# Patient Record
Sex: Female | Born: 1960 | Race: White | Hispanic: No | Marital: Married | Smoking: Former smoker
Health system: Southern US, Community
[De-identification: ages and names within clinical notes are randomized; demographics above are authoritative.]

## PROBLEM LIST (undated history)

## (undated) DIAGNOSIS — M1990 Unspecified osteoarthritis, unspecified site: Secondary | ICD-10-CM

## (undated) DIAGNOSIS — M810 Age-related osteoporosis without current pathological fracture: Secondary | ICD-10-CM

## (undated) DIAGNOSIS — IMO0002 Reserved for concepts with insufficient information to code with codable children: Secondary | ICD-10-CM

## (undated) DIAGNOSIS — M8580 Other specified disorders of bone density and structure, unspecified site: Secondary | ICD-10-CM

## (undated) DIAGNOSIS — K219 Gastro-esophageal reflux disease without esophagitis: Secondary | ICD-10-CM

## (undated) DIAGNOSIS — Z8619 Personal history of other infectious and parasitic diseases: Secondary | ICD-10-CM

## (undated) HISTORY — PX: BLADDER SUSPENSION: SHX72

## (undated) HISTORY — DX: Reserved for concepts with insufficient information to code with codable children: IMO0002

## (undated) HISTORY — PX: ROTATOR CUFF REPAIR: SHX139

## (undated) HISTORY — PX: WISDOM TOOTH EXTRACTION: SHX21

## (undated) HISTORY — PX: AUGMENTATION MAMMAPLASTY: SUR837

## (undated) HISTORY — DX: Personal history of other infectious and parasitic diseases: Z86.19

## (undated) HISTORY — DX: Gastro-esophageal reflux disease without esophagitis: K21.9

## (undated) HISTORY — DX: Age-related osteoporosis without current pathological fracture: M81.0

## (undated) HISTORY — DX: Other specified disorders of bone density and structure, unspecified site: M85.80

---

## 2007-10-12 DIAGNOSIS — IMO0002 Reserved for concepts with insufficient information to code with codable children: Secondary | ICD-10-CM

## 2007-10-12 HISTORY — DX: Reserved for concepts with insufficient information to code with codable children: IMO0002

## 2008-07-04 ENCOUNTER — Other Ambulatory Visit: Admission: RE | Admit: 2008-07-04 | Discharge: 2008-07-04 | Payer: Self-pay | Admitting: Gynecology

## 2008-07-04 ENCOUNTER — Ambulatory Visit: Payer: Self-pay | Admitting: Gynecology

## 2008-07-04 ENCOUNTER — Encounter: Payer: Self-pay | Admitting: Gynecology

## 2008-07-12 ENCOUNTER — Ambulatory Visit: Payer: Self-pay | Admitting: Gynecology

## 2009-02-08 LAB — HM MAMMOGRAPHY

## 2009-02-08 LAB — CONVERTED CEMR LAB: Pap Smear: NORMAL

## 2009-02-18 ENCOUNTER — Encounter: Admission: RE | Admit: 2009-02-18 | Discharge: 2009-02-18 | Payer: Self-pay | Admitting: Gynecology

## 2009-02-19 ENCOUNTER — Encounter: Payer: Self-pay | Admitting: Gynecology

## 2009-02-19 ENCOUNTER — Other Ambulatory Visit: Admission: RE | Admit: 2009-02-19 | Discharge: 2009-02-19 | Payer: Self-pay | Admitting: Gynecology

## 2009-02-19 ENCOUNTER — Ambulatory Visit: Payer: Self-pay | Admitting: Gynecology

## 2009-03-19 ENCOUNTER — Ambulatory Visit: Payer: Self-pay | Admitting: Family Medicine

## 2009-03-19 DIAGNOSIS — D239 Other benign neoplasm of skin, unspecified: Secondary | ICD-10-CM | POA: Insufficient documentation

## 2009-03-25 ENCOUNTER — Ambulatory Visit: Payer: Self-pay | Admitting: Family Medicine

## 2009-03-25 LAB — CONVERTED CEMR LAB
ALT: 22 units/L (ref 0–35)
Albumin: 4 g/dL (ref 3.5–5.2)
Basophils Relative: 0.3 % (ref 0.0–3.0)
Bilirubin, Direct: 0.1 mg/dL (ref 0.0–0.3)
CO2: 29 meq/L (ref 19–32)
Chloride: 106 meq/L (ref 96–112)
Creatinine, Ser: 0.7 mg/dL (ref 0.4–1.2)
Eosinophils Relative: 2.8 % (ref 0.0–5.0)
HCT: 37 % (ref 36.0–46.0)
Hemoglobin: 12.9 g/dL (ref 12.0–15.0)
LDL Cholesterol: 99 mg/dL (ref 0–99)
MCV: 93.9 fL (ref 78.0–100.0)
Monocytes Absolute: 0.6 10*3/uL (ref 0.1–1.0)
Neutro Abs: 3.2 10*3/uL (ref 1.4–7.7)
Neutrophils Relative %: 53.4 % (ref 43.0–77.0)
Nitrite: NEGATIVE
Potassium: 4.9 meq/L (ref 3.5–5.1)
RBC: 3.94 M/uL (ref 3.87–5.11)
Sodium: 138 meq/L (ref 135–145)
Specific Gravity, Urine: 1.015
Total CHOL/HDL Ratio: 3
Total Protein: 6.8 g/dL (ref 6.0–8.3)
Urobilinogen, UA: 0.2
WBC: 6 10*3/uL (ref 4.5–10.5)

## 2009-04-07 ENCOUNTER — Ambulatory Visit: Payer: Self-pay | Admitting: Family Medicine

## 2009-04-18 ENCOUNTER — Ambulatory Visit: Payer: Self-pay | Admitting: Internal Medicine

## 2009-04-29 ENCOUNTER — Ambulatory Visit: Payer: Self-pay | Admitting: Internal Medicine

## 2009-04-29 HISTORY — PX: COLONOSCOPY: SHX174

## 2009-12-16 ENCOUNTER — Other Ambulatory Visit: Admission: RE | Admit: 2009-12-16 | Discharge: 2009-12-16 | Payer: Self-pay | Admitting: Gynecology

## 2009-12-16 ENCOUNTER — Ambulatory Visit: Payer: Self-pay | Admitting: Gynecology

## 2010-03-27 ENCOUNTER — Ambulatory Visit: Payer: Self-pay | Admitting: Family Medicine

## 2010-03-27 DIAGNOSIS — D179 Benign lipomatous neoplasm, unspecified: Secondary | ICD-10-CM | POA: Insufficient documentation

## 2010-03-27 DIAGNOSIS — M25469 Effusion, unspecified knee: Secondary | ICD-10-CM

## 2010-03-27 DIAGNOSIS — M7710 Lateral epicondylitis, unspecified elbow: Secondary | ICD-10-CM | POA: Insufficient documentation

## 2010-05-06 ENCOUNTER — Ambulatory Visit: Payer: Self-pay | Admitting: Family Medicine

## 2010-05-06 DIAGNOSIS — H6640 Suppurative otitis media, unspecified, unspecified ear: Secondary | ICD-10-CM | POA: Insufficient documentation

## 2010-06-01 ENCOUNTER — Ambulatory Visit: Payer: Self-pay | Admitting: Family Medicine

## 2010-11-10 NOTE — Assessment & Plan Note (Signed)
Summary: SWOLLEN AREA IN BACK OF LEG/CJR   Vital Signs:  Patient profile:   50 year old female Temp:     98 degrees F oral BP sitting:   110 / 70  (left arm) Cuff size:   regular  Vitals Entered By: Sid Falcon LPN (March 27, 2010 4:15 PM) CC: Left back of knee lump, right elbow   History of Present Illness: Patient seen for several items as follows.  Right lateral elbow pain for the past several weeks. Frequent does weight lifting. Worse with gripping activities. Right-hand dominant. No specific injury. No visible swelling. Pain is lateral location without radiation. Tender to palpation. No alleviating factors. Has not tried icing.  Patient noted 3 days ago nonpainful swelling left proximal leg posteriorly. No pain with activities such as running.  See has noticed some mild swelling of the left knee over the past week or so. No locking or giving way. No pain with running with the exception of one episode of pain 2 days ago. She was able to run without difficulty today. No prior history of knee problems.  Allergies (verified): No Known Drug Allergies  Past History:  Family History: Last updated: 03/19/2009 Leukemia, mother  Social History: Last updated: 03/19/2009 Occupation:  Sales Divorced Alcohol use-yes Previous smoker  Review of Systems  The patient denies anorexia, fever, and weight loss.    Physical Exam  General:  Well-developed,well-nourished,in no acute distress; alert,appropriate and cooperative throughout examination Head:  Normocephalic and atraumatic without obvious abnormalities. No apparent alopecia or balding. Lungs:  Normal respiratory effort, chest expands symmetrically. Lungs are clear to auscultation, no crackles or wheezes. Heart:  normal rate and regular rhythm.   Extremities:  right elbow reveals no visible swelling. Full range of motion. No warmth or erythema. Tender over her lateral epicondyle.  Left leg reveals over the medial posterior  aspect of the proximal leg she has approximately 2 cm fatty lipomatous mass which is nontender. No overlying skin changes. No popliteal swelling. Left knee exam reveals very mild effusion. Cruciate and collateral ligament testing is normal. No medial or lateral joint line tenderness. Neurologic:  she has full strength lower extremities and symmetric reflexes. Skin:  no rashes and no suspicious lesions.     Knee Exam  Skin:    Intact, no scars, lesions, rashes, cafe au lait spots, or bruising.    Palpation:    Non-tender to palpation over medial joint line, lateral joint line, parapatellar, condylar, patellar tendon, or Pes bursa.   Vascular:    There was no swelling or varicose veins. The pulses and temperature are normal. There was no edema or tenderness.  Motor:    Motor strength 5/5 bilaterally for quadriceps, hamstrings, ankle dorsiflexion, and ankle plantar flexion.    Knee Exam:    Left:    Stability:  stable    Tenderness:  no    Swelling:  diffuse    Erythema:  no    Inspection/Palpation:  minimal effusion    Range of Motion:       Flexion-Active: full       Extension-Active: full       Flexion-Passive: full       Extension-Passive: full   Impression & Recommendations:  Problem # 1:  LIPOMA (ICD-214.9) Assessment New reassured this is benign. Imaging or further workup only if this is enlarging or becomes painful  Problem # 2:  JOINT EFFUSION, KNEE (ICD-719.06) Assessment: New very small effusion which is not related to #1.  Possible mild meniscal irritation. To avoid squatting-type activities and observe for now. This is not limiting or painful in any way at this point  Problem # 3:  LATERAL EPICONDYLITIS (ICD-726.32) Assessment: New recommend  regular icing. Offered corticosteroid injection and she wishes to observe. Avoid aggravating activities.  Complete Medication List: 1)  Fluoxetine Hcl 20 Mg Tabs (Fluoxetine hcl) .... Once daily 2)  Vitamin C 500 Mg Tabs  (Ascorbic acid) .... Once daily 3)  Vitamin D 1000 Unit Tabs (Cholecalciferol) .... Once daily  Patient Instructions: 1)  You have a small lipoma which is a benign fatty tumor involving the proximal leg 2)  You have a mild left knee joint effusion which is frequently seen with meniscal irritation 3)  You appear to have some tendinitis involving the right elbow. Ice regularly 2-3 times daily

## 2010-11-10 NOTE — Assessment & Plan Note (Signed)
Summary: severe ear ache/?inj/loss of balance/cjr   Vital Signs:  Patient profile:   50 year old female Temp:     98 degrees F oral BP sitting:   92 / 62  (left arm) Cuff size:   regular  Vitals Entered By: Sid Falcon LPN (May 06, 2010 12:19 PM) CC: Severe right ear pain   History of Present Illness: One-week history of right earache. Patient denies any fever, chills, or dizziness. Mild sore throat. Minimal nasal congestion. No cough. No known drug allergies. No history of recurrent otitis media. Nonsmoker. Exacerbated by lying of R side.  Minimal improvement with Advil.  Allergies (verified): No Known Drug Allergies  Review of Systems      See HPI  Physical Exam  General:  Well-developed,well-nourished,in no acute distress; alert,appropriate and cooperative throughout examination Ears:  left internal appears normal. Right eardrum reveals purulent effusion. Nose no skin erythema. Loss of landmarks. No evidence for perforation Mouth:  Oral mucosa and oropharynx without lesions or exudates.  Teeth in good repair. Neck:  No deformities, masses, or tenderness noted. Lungs:  Normal respiratory effort, chest expands symmetrically. Lungs are clear to auscultation, no crackles or wheezes. Heart:  normal rate and regular rhythm.     Impression & Recommendations:  Problem # 1:  UNSPECIFIED SUPPURATIVE OTITIS MEDIA (ICD-382.4) Assessment New  Her updated medication list for this problem includes:    Amoxicillin 875 Mg Tabs (Amoxicillin) ..... One by mouth two times a day for 10 days  Complete Medication List: 1)  Fluoxetine Hcl 20 Mg Tabs (Fluoxetine hcl) .... Once daily 2)  Vitamin C 500 Mg Tabs (Ascorbic acid) .... Once daily 3)  Vitamin D 1000 Unit Tabs (Cholecalciferol) .... Once daily 4)  Amoxicillin 875 Mg Tabs (Amoxicillin) .... One by mouth two times a day for 10 days  Patient Instructions: 1)  Followup in 2 weeks if ear symptoms are not  resolving Prescriptions: AMOXICILLIN 875 MG TABS (AMOXICILLIN) one by mouth two times a day for 10 days  #20 x 0   Entered and Authorized by:   Evelena Peat MD   Signed by:   Evelena Peat MD on 05/06/2010   Method used:   Electronically to        [facility]. [phone]* (retail)       [address]       [place], [place]  [address]       Ph: [phone]       Fax: [phone]   RxID:   [id number]

## 2010-11-10 NOTE — Assessment & Plan Note (Signed)
Summary: ear pain//ccm   Vital Signs:  Patient profile:   50 year old female Temp:     98.7 degrees F oral BP sitting:   98 / 70  (left arm) Cuff size:   regular  Vitals Entered By: Sid Falcon LPN (June 01, 2010 1:09 PM)  History of Present Illness: Is seen with recurrent right ear pain. Refer to prior note. Symptoms did improve with amoxicillin initially. Recurrence of symptoms yesterday. Occasional fleeting vertigo. Also some right sided facial pains. No purulent secretions. Denies fever or chills. No hearing loss  Allergies (verified): No Known Drug Allergies  Past History:  Social History: Last updated: 03/19/2009 Occupation:  Sales Divorced Alcohol use-yes Previous smoker  Past Medical History: no chronic problems. PMH reviewed for relevance, SH/Risk Factors reviewed for relevance  Physical Exam  General:  Well-developed,well-nourished,in no acute distress; alert,appropriate and cooperative throughout examination Ears:  patient has persistent right supperative type effusion but no erythema no bulging. Left eardrum is normal Mouth:  Oral mucosa and oropharynx without lesions or exudates.  Teeth in good repair. Neck:  No deformities, masses, or tenderness noted. Lungs:  Normal respiratory effort, chest expands symmetrically. Lungs are clear to auscultation, no crackles or wheezes.   Impression & Recommendations:  Problem # 1:  UNSPECIFIED SUPPURATIVE OTITIS MEDIA (ICD-382.4) broaden coverage to Augmentin.  ENT referral if no better 2 weeks. Her updated medication list for this problem includes:    Amoxicillin-pot Clavulanate 875-125 Mg Tabs (Amoxicillin-pot clavulanate) ..... One by mouth two times a day for 10 days  Complete Medication List: 1)  Fluoxetine Hcl 20 Mg Tabs (Fluoxetine hcl) .... Once daily 2)  Vitamin C 500 Mg Tabs (Ascorbic acid) .... Once daily 3)  Vitamin D 1000 Unit Tabs (Cholecalciferol) .... Once daily 4)  Amoxicillin-pot Clavulanate  875-125 Mg Tabs (Amoxicillin-pot clavulanate) .... One by mouth two times a day for 10 days  Patient Instructions: 1)  Call in 2 weeks if symptoms not resolved or if any recurrence of symptoms. Prescriptions: AMOXICILLIN-POT CLAVULANATE 875-125 MG TABS (AMOXICILLIN-POT CLAVULANATE) one by mouth two times a day for 10 days  #20 x 0   Entered and Authorized by:   Evelena Peat MD   Signed by:   Evelena Peat MD on 06/01/2010   Method used:   Electronically to        [facility]. [phone]* (retail)       [address]       [place], [place]  [address]       Ph: [phone]       Fax: [phone]   RxID:   [id number]

## 2010-12-18 ENCOUNTER — Encounter: Payer: Self-pay | Admitting: Gynecology

## 2010-12-21 ENCOUNTER — Encounter (INDEPENDENT_AMBULATORY_CARE_PROVIDER_SITE_OTHER): Payer: BC Managed Care – PPO | Admitting: Gynecology

## 2010-12-21 ENCOUNTER — Other Ambulatory Visit (HOSPITAL_COMMUNITY)
Admission: RE | Admit: 2010-12-21 | Discharge: 2010-12-21 | Disposition: A | Discharge status: Home / Self Care | Payer: BC Managed Care – PPO | Source: Ambulatory Visit | Attending: Gynecology | Admitting: Gynecology

## 2010-12-21 ENCOUNTER — Other Ambulatory Visit: Payer: Self-pay | Admitting: Gynecology

## 2010-12-21 DIAGNOSIS — Z13220 Encounter for screening for lipoid disorders: Secondary | ICD-10-CM

## 2010-12-21 DIAGNOSIS — Z124 Encounter for screening for malignant neoplasm of cervix: Secondary | ICD-10-CM | POA: Insufficient documentation

## 2010-12-21 DIAGNOSIS — Z833 Family history of diabetes mellitus: Secondary | ICD-10-CM

## 2010-12-21 DIAGNOSIS — N926 Irregular menstruation, unspecified: Secondary | ICD-10-CM

## 2010-12-21 DIAGNOSIS — Z01419 Encounter for gynecological examination (general) (routine) without abnormal findings: Secondary | ICD-10-CM

## 2011-10-15 ENCOUNTER — Telehealth: Payer: Self-pay | Admitting: Family Medicine

## 2011-10-15 ENCOUNTER — Other Ambulatory Visit: Payer: Self-pay | Admitting: *Deleted

## 2011-10-15 ENCOUNTER — Encounter: Payer: Self-pay | Admitting: Family

## 2011-10-15 ENCOUNTER — Ambulatory Visit (INDEPENDENT_AMBULATORY_CARE_PROVIDER_SITE_OTHER): Payer: BC Managed Care – PPO | Admitting: Family

## 2011-10-15 DIAGNOSIS — H6980 Other specified disorders of Eustachian tube, unspecified ear: Secondary | ICD-10-CM

## 2011-10-15 DIAGNOSIS — J069 Acute upper respiratory infection, unspecified: Secondary | ICD-10-CM

## 2011-10-15 MED ORDER — FEXOFENADINE-PSEUDOEPHED ER 180-240 MG PO TB24: 1.0000 | ORAL_TABLET | Freq: Every day | ORAL | Status: DC

## 2011-10-15 MED ORDER — ANTIPYRINE-BENZOCAINE 5.4-1.4 % OT SOLN: 3.0000 [drp] | OTIC | Status: DC | PRN

## 2011-10-15 MED ORDER — ANTIPYRINE-BENZOCAINE 5.4-1.4 % OT SOLN: 3.0000 [drp] | OTIC | Status: AC | PRN

## 2011-10-15 NOTE — Patient Instructions (Signed)
Upper Respiratory Infection, Adult An upper respiratory infection (URI) is also sometimes known as the common cold. The upper respiratory tract includes the nose, sinuses, throat, trachea, and bronchi. Bronchi are the airways leading to the lungs. Most people improve within 1 week, but symptoms can last up to 2 weeks. A residual cough may last even longer.  CAUSES Many different viruses can infect the tissues lining the upper respiratory tract. The tissues become irritated and inflamed and often become very moist. Mucus production is also common. A cold is contagious. You can easily spread the virus to others by oral contact. This includes kissing, sharing a glass, coughing, or sneezing. Touching your mouth or nose and then touching a surface, which is then touched by another person, can also spread the virus. SYMPTOMS  Symptoms typically develop 1 to 3 days after you come in contact with a cold virus. Symptoms vary from person to person. They may include:  Runny nose.   Sneezing.   Nasal congestion.   Sinus irritation.   Sore throat.   Loss of voice (laryngitis).   Cough.   Fatigue.   Muscle aches.   Loss of appetite.   Headache.   Low-grade fever.  DIAGNOSIS  You might diagnose your own cold based on familiar symptoms, since most people get a cold 2 to 3 times a year. Your caregiver can confirm this based on your exam. Most importantly, your caregiver can check that your symptoms are not due to another disease such as strep throat, sinusitis, pneumonia, asthma, or epiglottitis. Blood tests, throat tests, and X-rays are not necessary to diagnose a common cold, but they may sometimes be helpful in excluding other more serious diseases. Your caregiver will decide if any further tests are required. RISKS AND COMPLICATIONS  You may be at risk for a more severe case of the common cold if you smoke cigarettes, have chronic heart disease (such as heart failure) or lung disease (such as  asthma), or if you have a weakened immune system. The very young and very old are also at risk for more serious infections. Bacterial sinusitis, middle ear infections, and bacterial pneumonia can complicate the common cold. The common cold can worsen asthma and chronic obstructive pulmonary disease (COPD). Sometimes, these complications can require emergency medical care and may be life-threatening. PREVENTION  The best way to protect against getting a cold is to practice good hygiene. Avoid oral or hand contact with people with cold symptoms. Wash your hands often if contact occurs. There is no clear evidence that vitamin C, vitamin E, echinacea, or exercise reduces the chance of developing a cold. However, it is always recommended to get plenty of rest and practice good nutrition. TREATMENT  Treatment is directed at relieving symptoms. There is no cure. Antibiotics are not effective, because the infection is caused by a virus, not by bacteria. Treatment may include:  Increased fluid intake. Sports drinks offer valuable electrolytes, sugars, and fluids.   Breathing heated mist or steam (vaporizer or shower).   Eating chicken soup or other clear broths, and maintaining good nutrition.   Getting plenty of rest.   Using gargles or lozenges for comfort.   Controlling fevers with ibuprofen or acetaminophen as directed by your caregiver.   Increasing usage of your inhaler if you have asthma.  Zinc gel and zinc lozenges, taken in the first 24 hours of the common cold, can shorten the duration and lessen the severity of symptoms. Pain medicines may help with fever, muscle   aches, and throat pain. A variety of non-prescription medicines are available to treat congestion and runny nose. Your caregiver can make recommendations and may suggest nasal or lung inhalers for other symptoms.  HOME CARE INSTRUCTIONS   Only take over-the-counter or prescription medicines for pain, discomfort, or fever as directed  by your caregiver.   Use a warm mist humidifier or inhale steam from a shower to increase air moisture. This may keep secretions moist and make it easier to breathe.   Drink enough water and fluids to keep your urine clear or pale yellow.   Rest as needed.   Return to work when your temperature has returned to normal or as your caregiver advises. You may need to stay home longer to avoid infecting others. You can also use a face mask and careful hand washing to prevent spread of the virus.  SEEK MEDICAL CARE IF:   After the first few days, you feel you are getting worse rather than better.   You need your caregiver's advice about medicines to control symptoms.   You develop chills, worsening shortness of breath, or brown or red sputum. These may be signs of pneumonia.   You develop yellow or brown nasal discharge or pain in the face, especially when you bend forward. These may be signs of sinusitis.   You develop a fever, swollen neck glands, pain with swallowing, or white areas in the back of your throat. These may be signs of strep throat.  SEEK IMMEDIATE MEDICAL CARE IF:   You have a fever.   You develop severe or persistent headache, ear pain, sinus pain, or chest pain.   You develop wheezing, a prolonged cough, cough up blood, or have a change in your usual mucus (if you have chronic lung disease).   You develop sore muscles or a stiff neck.  Document Released: 03/23/2001 Document Revised: 06/09/2011 Document Reviewed: 01/29/2011 [facility] Patient Information 2012 [place], [place].   Barotitis Media Barotitis media is soreness (inflammation) of the area behind the eardrum (middle ear). This occurs when the auditory tube (Eustachian tube) leading from the back of the throat to the eardrum is blocked. When it is blocked air cannot move in and out of the middle ear to equalize pressure changes. These pressure changes come from changes in altitude when:  Flying.   Driving in the  mountains.   Diving.  Problems are more likely to occur with pressure changes during times when you are congested as from:  Hay fever.   Upper respiratory infection.   A cold.  Damage or hearing loss (barotrauma) caused by this may be permanent. HOME CARE INSTRUCTIONS   Use medicines as recommended by your caregiver. Over the counter medicines will help unblock the canal and can help during times of air travel.   Do not put anything into your ears to clean or unplug them. Eardrops will not be helpful.   Do not swim, dive, or fly until your caregiver says it is all right to do so. If these activities are necessary, chewing gum with frequent swallowing may help. It is also helpful to hold your nose and gently blow to pop your ears for equalizing pressure changes. This forces air into the Eustachian tube.   For little ones with problems, give your baby a bottle of water or juice during periods when pressure changes would be anticipated such as during take offs and landings associated with air travel.   Only take over-the-counter or prescription medicines for pain,  discomfort, or fever as directed by your caregiver.   A decongestant may be helpful in de-congesting the middle ear and make pressure equalization easier. This can be even more effective if the drops (spray) are delivered with the head lying over the edge of a bed with the head tilted toward the ear on the affected side.   If your caregiver has given you a follow-up appointment, it is very important to keep that appointment. Not keeping the appointment could result in a chronic or permanent injury, pain, hearing loss and disability. If there is any problem keeping the appointment, you must call back to this facility for assistance.  SEEK IMMEDIATE MEDICAL CARE IF:   You develop a severe headache, dizziness, severe ear pain, or bloody or pus-like drainage from your ears.   An oral temperature above 102 F (38.9 C) develops.    Your problems do not improve or become worse.  MAKE SURE YOU:   Understand these instructions.   Will watch your condition.   Will get help right away if you are not doing well or get worse.  Document Released: 09/24/2000 Document Revised: 06/09/2011 Document Reviewed: 05/02/2008 [facility] Patient Information 2012 [place], [place].

## 2011-10-15 NOTE — Telephone Encounter (Signed)
fyi- pharmacy needs to be updated to [facility] at [employer] received one rx and clarified the other rx

## 2011-10-15 NOTE — Telephone Encounter (Signed)
Pt informed on VM of correction

## 2011-10-15 NOTE — Telephone Encounter (Signed)
Pt when to pharmacy to pick up meds and pharmacy said they did not receive scripts, pt requesting for meds to be re-submitted to [facility] and then contact her

## 2011-10-15 NOTE — Progress Notes (Signed)
  Subjective:    Patient ID: Jackie Hubbard, female    DOB: 1961/05/23, 51 y.o.   MRN: [id number]  HPI 51 year old white female, former smoker smoker patient of Dr. Caryl Never is in today with complaints of right ear pain, cough, sore throat and fatigue and lack of 2 days. She's taken NyQuil and Mucinex over-the-counter with no relief. Denies any fever, shortness of breath, chest pain, or myalgias.   Review of Systems  Constitutional: Positive for fatigue.  HENT: Positive for ear pain.   Eyes: Negative.   Respiratory: Positive for cough.   Cardiovascular: Negative.   Musculoskeletal: Negative.   Skin: Negative.   Neurological: Negative.   Hematological: Negative.   Psychiatric/Behavioral: Negative.        Objective:   Physical Exam  Constitutional: She is oriented to person, place, and time. She appears well-developed and well-nourished.  HENT:  Right Ear: External ear normal.  Left Ear: External ear normal.  Nose: Nose normal.  Mouth/Throat: Oropharynx is clear and moist.       Ears bilaterally small amount of fluid.   Eyes: Conjunctivae are normal. Pupils are equal, round, and reactive to light.  Neck: Normal range of motion.  Cardiovascular: Normal rate and normal heart sounds.   Pulmonary/Chest: Effort normal.  Musculoskeletal: Normal range of motion.  Neurological: She is alert and oriented to person, place, and time.  Skin: Skin is warm and dry.  Psychiatric: She has a normal mood and affect.          Assessment & Plan:  Assessment: Viral upper respiratory infection, eustachian tube dysfunction  Plan: Allegra-D 24 hours by mouth daily. Auralgan otic 3 drops in the right ear every 2 hours when necessary pain. Patient thought the office if symptoms worsen or persist, recheck reschedule when necessary.

## 2012-04-18 ENCOUNTER — Other Ambulatory Visit: Payer: Self-pay | Admitting: Gynecology

## 2012-04-18 DIAGNOSIS — Z1231 Encounter for screening mammogram for malignant neoplasm of breast: Secondary | ICD-10-CM

## 2012-04-25 ENCOUNTER — Telehealth: Payer: Self-pay | Admitting: Family Medicine

## 2012-04-25 ENCOUNTER — Ambulatory Visit (INDEPENDENT_AMBULATORY_CARE_PROVIDER_SITE_OTHER): Payer: BC Managed Care – PPO | Admitting: Family Medicine

## 2012-04-25 ENCOUNTER — Encounter: Payer: Self-pay | Admitting: Family Medicine

## 2012-04-25 VITALS — BP 106/60 | HR 65 | Temp 98.4°F | Wt 138.0 lb

## 2012-04-25 DIAGNOSIS — R0789 Other chest pain: Secondary | ICD-10-CM

## 2012-04-25 DIAGNOSIS — K219 Gastro-esophageal reflux disease without esophagitis: Secondary | ICD-10-CM

## 2012-04-25 MED ORDER — OMEPRAZOLE 40 MG PO CPDR: 40.0000 mg | DELAYED_RELEASE_CAPSULE | Freq: Every day | ORAL | Status: DC

## 2012-04-25 NOTE — Telephone Encounter (Signed)
Appt made with Dr. Clent Ridges for 3:45. Pt was happy with coming to office to see someone.

## 2012-04-25 NOTE — Progress Notes (Signed)
  Subjective:    Patient ID: Jackie Hubbard, female    DOB: November 28, 1960, 51 y.o.   MRN: [id number]  HPI Here for 2 weeks of frequent chest pains which she describes as dull achy pains or pressure like pains in the center of the chest. These are not associated with exertion. There is no SOB or nausea. She does have frequent sweats, but she thinks this is due to menopause since her LMP was 4 months ago. She has lots of heartburn and indigestion, and food sometimes stops partway down when swallowing. She takes TUMS several times a day. She has been under a lot of stress lately. 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 She worked long hours, ate an erratic diet, and drank lots of [employer] coffee. Now she is back home and she feels better, although the chest pains continue. She is a runner, and exercising has no effect on these pains.    Review of Systems  Constitutional: Negative.   Respiratory: Negative.   Cardiovascular: Positive for chest pain. Negative for palpitations and leg swelling.  Gastrointestinal: Negative.        Objective:   Physical Exam  Constitutional: She appears well-developed and well-nourished. No distress.  Neck: Neck supple. No thyromegaly present.  Cardiovascular: Normal rate, regular rhythm, normal heart sounds and intact distal pulses.  Exam reveals no gallop and no friction rub.   No murmur heard.      EKG shows incomplete RBBB only   Pulmonary/Chest: Effort normal and breath sounds normal. No respiratory distress. She has no wheezes. She has no rales. She exhibits no tenderness.  Lymphadenopathy:    She has no cervical adenopathy.          Assessment & Plan:  She definitely has GERD, and her chest pains are probably esophageal in nature. We discussed reducing her caffeine intake. Try Omeprazole daily at least for a few weeks. Recheck prn

## 2012-04-25 NOTE — Telephone Encounter (Signed)
Caller: Susy/Mother; PCP: Evelena Peat; CB#: [phone]; ; ; Call regarding Chest Pain/Chest Discomfort;  Onset-couple of weeks ago per pt. She c/o of pain between breast in center of her chest. It is worst at night. Pt thinks she may have pulled a muscle.  She rates pain at a 5 today. Emergent s/s of Chest pain protocol states to ED. Per protocol must give a heads up, per [facility] in office Dr. Caryl Never recommends pt come into office today for evaluation or she can go to ED if desired. Pt chooses to come into office today, call tranferred to [facility] to schedule.

## 2012-05-01 ENCOUNTER — Ambulatory Visit
Admission: RE | Admit: 2012-05-01 | Discharge: 2012-05-01 | Disposition: A | Discharge status: Home / Self Care | Payer: BC Managed Care – PPO | Source: Ambulatory Visit | Attending: Gynecology | Admitting: Gynecology

## 2012-05-01 DIAGNOSIS — Z1231 Encounter for screening mammogram for malignant neoplasm of breast: Secondary | ICD-10-CM

## 2012-09-01 ENCOUNTER — Ambulatory Visit (INDEPENDENT_AMBULATORY_CARE_PROVIDER_SITE_OTHER): Payer: BC Managed Care – PPO | Admitting: Family Medicine

## 2012-09-01 ENCOUNTER — Encounter: Payer: Self-pay | Admitting: Family Medicine

## 2012-09-01 VITALS — BP 90/70 | Temp 98.2°F | Wt 138.0 lb

## 2012-09-01 DIAGNOSIS — B029 Zoster without complications: Secondary | ICD-10-CM

## 2012-09-01 MED ORDER — VALACYCLOVIR HCL 1 G PO TABS: 1000.0000 mg | ORAL_TABLET | Freq: Three times a day (TID) | ORAL | Status: DC

## 2012-09-01 NOTE — Progress Notes (Signed)
  Subjective:    Patient ID: Jackie Hubbard, female    DOB: [birth year], 51 y.o.   MRN: [id number]  HPI  Five-day history of somewhat indurated lesion over right brow region. She's also noticed some minimally tender areas over her right scalp. She's not had any blurred vision or eye redness. She also noticed inflamed right posterior auricular node. No fever or chills. Past has history of chickenpox. Her pain quality is somewhat of a soreness and somewhat of a burning /stinging quality.   Review of Systems  Constitutional: Negative for fever and chills.  Eyes: Negative for photophobia, pain, redness and visual disturbance.  Skin: Positive for rash.  Neurological: Negative for dizziness and headaches.       Objective:   Physical Exam  Constitutional: She appears well-developed and well-nourished.  HENT:  Right Ear: External ear normal.  Left Ear: External ear normal.       Corneas appear normal bilaterally.  Eyes: Pupils are equal, round, and reactive to light.  Neck: Neck supple.       Patient has slightly tender approximately 1 cm right posterior regular lymph node  Cardiovascular: Normal rate and regular rhythm.   Pulmonary/Chest: Effort normal and breath sounds normal.  Skin:       Patient has slightly indurated minimally vesicular patch of right eyebrow. She has some slightly indurated minimally tender erythematous patches right scalp.          Assessment & Plan:  Shingles. No evidence for eye involvement at this time. Given proximity to eye will go ahead and treat with Valtrex 1 g 3 times a day for 7 days. She's not needing pain medication this time. Followup immediately for any blurred vision or eye redness.

## 2012-09-01 NOTE — Patient Instructions (Addendum)
Shingles Shingles is caused by the same virus that causes chickenpox (varicella zoster virus or VZV). Shingles often occurs many years or decades after having chickenpox. That is why it is more common in adults older than 50 years. The virus reactivates and breaks out as an infection in a nerve root. SYMPTOMS   The initial feeling (sensations) may be pain. This pain is usually described as:  Burning.  Stabbing.  Throbbing.  Tingling in the nerve root.  A red rash will follow in a couple days. The rash may occur in any area of the body and is usually on one side (unilateral) of the body in a band or belt-like pattern. The rash usually starts out as very small blisters (vesicles). They will dry up after 7 to 10 days. This is not usually a significant problem except for the pain it causes.  Long-lasting (chronic) pain is more likely in an elderly person. It can last months to years. This condition is called postherpetic neuralgia. Shingles can be an extremely severe infection in someone with AIDS, a weakened immune system, or with forms of leukemia. It can also be severe if you are taking transplant medicines or other medicines that weaken the immune system. TREATMENT  Your caregiver will often treat you with:  Antiviral drugs.  Anti-inflammatory drugs.  Pain medicines. Bed rest is very important in preventing the pain associated with herpes zoster (postherpetic neuralgia). Application of heat in the form of a hot water bottle or electric heating pad or gentle pressure with the hand is recommended to help with the pain or discomfort. PREVENTION  A varicella zoster vaccine is available to help protect against the virus. The Food and Drug Administration approved the varicella zoster vaccine for individuals 106 years of age and older. HOME CARE INSTRUCTIONS   Cool compresses to the area of rash may be helpful.  Only take over-the-counter or prescription medicines for pain, discomfort, or  fever as directed by your caregiver.  Avoid contact with:  Babies.  Pregnant women.  Children with eczema.  Elderly people with transplants.  People with chronic illnesses, such as leukemia and AIDS.  If the area involved is on your face, you may receive a referral for follow-up to a specialist. It is very important to keep all follow-up appointments. This will help avoid eye complications, chronic pain, or disability. SEEK IMMEDIATE MEDICAL CARE IF:   You develop any pain (headache) in the area of the face or eye. This must be followed carefully by your caregiver or ophthalmologist. An infection in part of your eye (cornea) can be very serious. It could lead to blindness.  You do not have pain relief from prescribed medicines.  Your redness or swelling spreads.  The area involved becomes very swollen and painful.  You have a fever.  You notice any red or painful lines extending away from the affected area toward your heart (lymphangitis).  Your condition is worsening or has changed. Document Released: 09/27/2005 Document Revised: 12/20/2011 Document Reviewed: 09/01/2009 [facility] Patient Information 2013 [place], [place].  Follow up immediately for any blurred vision or eye redness.

## 2013-07-16 ENCOUNTER — Other Ambulatory Visit: Payer: Self-pay

## 2013-07-16 DIAGNOSIS — Z9889 Other specified postprocedural states: Secondary | ICD-10-CM

## 2013-07-16 DIAGNOSIS — Z1231 Encounter for screening mammogram for malignant neoplasm of breast: Secondary | ICD-10-CM

## 2013-08-07 ENCOUNTER — Ambulatory Visit
Admission: RE | Admit: 2013-08-07 | Discharge: 2013-08-07 | Disposition: A | Discharge status: Home / Self Care | Payer: 59 | Source: Ambulatory Visit

## 2013-08-07 DIAGNOSIS — Z1231 Encounter for screening mammogram for malignant neoplasm of breast: Secondary | ICD-10-CM

## 2013-08-07 DIAGNOSIS — Z9889 Other specified postprocedural states: Secondary | ICD-10-CM

## 2013-10-26 ENCOUNTER — Encounter: Payer: Self-pay | Admitting: Gynecology

## 2013-10-26 ENCOUNTER — Other Ambulatory Visit (HOSPITAL_COMMUNITY)
Admission: RE | Admit: 2013-10-26 | Discharge: 2013-10-26 | Disposition: A | Discharge status: Home / Self Care | Payer: 59 | Source: Ambulatory Visit | Attending: Gynecology | Admitting: Gynecology

## 2013-10-26 ENCOUNTER — Ambulatory Visit (INDEPENDENT_AMBULATORY_CARE_PROVIDER_SITE_OTHER): Payer: 59 | Admitting: Gynecology

## 2013-10-26 VITALS — BP 110/60 | Ht 63.0 in | Wt 144.0 lb

## 2013-10-26 DIAGNOSIS — Z01419 Encounter for gynecological examination (general) (routine) without abnormal findings: Secondary | ICD-10-CM | POA: Insufficient documentation

## 2013-10-26 DIAGNOSIS — Z1151 Encounter for screening for human papillomavirus (HPV): Secondary | ICD-10-CM | POA: Insufficient documentation

## 2013-10-26 DIAGNOSIS — Z780 Asymptomatic menopausal state: Secondary | ICD-10-CM

## 2013-10-26 LAB — COMPREHENSIVE METABOLIC PANEL
ALBUMIN: 4.2 g/dL (ref 3.5–5.2)
ALT: 19 U/L (ref 0–35)
AST: 20 U/L (ref 0–37)
Alkaline Phosphatase: 78 U/L (ref 39–117)
BUN: 15 mg/dL (ref 6–23)
CALCIUM: 9.8 mg/dL (ref 8.4–10.5)
CHLORIDE: 102 meq/L (ref 96–112)
CO2: 25 meq/L (ref 19–32)
CREATININE: 0.82 mg/dL (ref 0.50–1.10)
Glucose, Bld: 79 mg/dL (ref 70–99)
POTASSIUM: 4.1 meq/L (ref 3.5–5.3)
SODIUM: 138 meq/L (ref 135–145)
TOTAL PROTEIN: 6.5 g/dL (ref 6.0–8.3)
Total Bilirubin: 0.4 mg/dL (ref 0.3–1.2)

## 2013-10-26 LAB — TSH: TSH: 1.73 u[IU]/mL (ref 0.350–4.500)

## 2013-10-26 LAB — CBC WITH DIFFERENTIAL/PLATELET
BASOS ABS: 0.1 10*3/uL (ref 0.0–0.1)
Basophils Relative: 1 % (ref 0–1)
EOS PCT: 2 % (ref 0–5)
Eosinophils Absolute: 0.1 10*3/uL (ref 0.0–0.7)
HCT: 41 % (ref 36.0–46.0)
Hemoglobin: 13.8 g/dL (ref 12.0–15.0)
LYMPHS ABS: 2 10*3/uL (ref 0.7–4.0)
LYMPHS PCT: 34 % (ref 12–46)
MCH: 30.5 pg (ref 26.0–34.0)
MCHC: 33.7 g/dL (ref 30.0–36.0)
MCV: 90.5 fL (ref 78.0–100.0)
Monocytes Absolute: 0.4 10*3/uL (ref 0.1–1.0)
Monocytes Relative: 7 % (ref 3–12)
NEUTROS ABS: 3.3 10*3/uL (ref 1.7–7.7)
Neutrophils Relative %: 56 % (ref 43–77)
PLATELETS: 231 10*3/uL (ref 150–400)
RBC: 4.53 MIL/uL (ref 3.87–5.11)
RDW: 13.1 % (ref 11.5–15.5)
WBC: 5.9 10*3/uL (ref 4.0–10.5)

## 2013-10-26 LAB — URINALYSIS W MICROSCOPIC + REFLEX CULTURE
BILIRUBIN URINE: NEGATIVE
Bacteria, UA: NONE SEEN
CRYSTALS: NONE SEEN
Casts: NONE SEEN
GLUCOSE, UA: NEGATIVE mg/dL
Hgb urine dipstick: NEGATIVE
Ketones, ur: NEGATIVE mg/dL
Nitrite: NEGATIVE
PH: 7 (ref 5.0–8.0)
PROTEIN: NEGATIVE mg/dL
RBC / HPF: NONE SEEN RBC/hpf (ref <3)
SPECIFIC GRAVITY, URINE: 1.012 (ref 1.005–1.030)
SQUAMOUS EPITHELIAL / LPF: NONE SEEN
Urobilinogen, UA: 0.2 mg/dL (ref 0.0–1.0)

## 2013-10-26 LAB — LIPID PANEL
CHOL/HDL RATIO: 2.6 ratio
CHOLESTEROL: 189 mg/dL (ref 0–200)
HDL: 74 mg/dL (ref >39)
LDL Cholesterol: 101 mg/dL — ABNORMAL HIGH (ref 0–99)
Triglycerides: 70 mg/dL (ref <150)
VLDL: 14 mg/dL (ref 0–40)

## 2013-10-26 NOTE — Progress Notes (Signed)
Jackie Hubbard December 05, 1960 [id number]        53 y.o.  G3P3 for annual exam.  Doing well. It's been 2 years since her last visit.  Past medical history,surgical history, problem list, medications, allergies, family history and social history were all reviewed and documented in the EPIC chart.  ROS:  Performed and pertinent positives and negatives are included in the history, assessment and plan .  Exam: Kim assistant Filed Vitals:   10/26/13 0934  BP: 110/60  Height: 5\' 3"  (1.6 m)  Weight: 144 lb (65.318 kg)   General appearance  Normal Skin grossly normal Head/Neck normal with no cervical or supraclavicular adenopathy thyroid normal Lungs  clear Cardiac RR, without RMG Abdominal  soft, nontender, without masses, organomegaly or hernia Breasts  examined lying and sitting without masses, retractions, discharge or axillary adenopathy. Bilateral implants noted. Pelvic  Ext/BUS/vagina  Normal  Cervix  Normal with Pap/HPV done  Uterus  anteverted, normal size, shape and contour, midline and mobile nontender   Adnexa  Without masses or tenderness    Anus and perineum  Normal   Rectovaginal  Normal sphincter tone without palpated masses or tenderness.    Assessment/Plan:  53 y.o. G3P3 female for annual exam.   1. Postmenopausal. Patient without symptoms of hot flushes, night sweats, vaginal dryness or dyspareunia. No vaginal bleeding. Will continue to monitor. Call if any vaginal bleeding. 2. Pap smear 2012. Pap/HPV today. 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 Expectant management with normal Pap smear since then. 3. Mammography 07/2013. Continue with annual mammography. SBE monthly reviewed. 4. DEXA never. Recommended baseline now. Calcium/vitamin D recommendations reviewed. Check vitamin D level today. 5. Colonoscopy 2010. Repeat at their recommended interval. 6. Health maintenance. Patient requests baseline labs. CBC comprehensive metabolic panel lipid profile urinalysis  TSH vitamin D ordered. Follow up for bone density otherwise annually.   Note: This document was prepared with digital dictation and possible smart phrase technology. Any transcriptional errors that result from this process are unintentional.   Anastasio Auerbach MD, 9:56 AM 10/26/2013

## 2013-10-26 NOTE — Patient Instructions (Signed)
Followup for bone density as scheduled. Otherwise followup in one year for annual exam. Sooner if any issues.

## 2013-10-26 NOTE — Addendum Note (Signed)
Addended by: Nelva Nay on: 10/26/2013 10:04 AM   Modules accepted: Orders

## 2013-10-27 LAB — URINE CULTURE

## 2013-10-27 LAB — VITAMIN D 25 HYDROXY (VIT D DEFICIENCY, FRACTURES): Vit D, 25-Hydroxy: 42 ng/mL (ref 30–89)

## 2013-12-09 DIAGNOSIS — M8580 Other specified disorders of bone density and structure, unspecified site: Secondary | ICD-10-CM

## 2013-12-09 HISTORY — DX: Other specified disorders of bone density and structure, unspecified site: M85.80

## 2013-12-17 ENCOUNTER — Ambulatory Visit (INDEPENDENT_AMBULATORY_CARE_PROVIDER_SITE_OTHER): Payer: 59

## 2013-12-17 ENCOUNTER — Other Ambulatory Visit: Payer: Self-pay | Admitting: Gynecology

## 2013-12-17 DIAGNOSIS — Z01419 Encounter for gynecological examination (general) (routine) without abnormal findings: Secondary | ICD-10-CM

## 2013-12-17 DIAGNOSIS — M8580 Other specified disorders of bone density and structure, unspecified site: Secondary | ICD-10-CM

## 2013-12-17 DIAGNOSIS — M949 Disorder of cartilage, unspecified: Secondary | ICD-10-CM

## 2013-12-17 DIAGNOSIS — M899 Disorder of bone, unspecified: Secondary | ICD-10-CM

## 2013-12-18 ENCOUNTER — Encounter: Payer: Self-pay | Admitting: Gynecology

## 2014-04-30 ENCOUNTER — Ambulatory Visit (INDEPENDENT_AMBULATORY_CARE_PROVIDER_SITE_OTHER): Payer: 59 | Admitting: Physician Assistant

## 2014-04-30 ENCOUNTER — Encounter: Payer: Self-pay | Admitting: Physician Assistant

## 2014-04-30 VITALS — BP 90/60 | HR 60 | Temp 97.6°F | Resp 18 | Wt 146.0 lb

## 2014-04-30 DIAGNOSIS — R1310 Dysphagia, unspecified: Secondary | ICD-10-CM

## 2014-04-30 LAB — POCT RAPID STREP A (OFFICE): RAPID STREP A SCREEN: NEGATIVE

## 2014-04-30 NOTE — Progress Notes (Signed)
Pre visit review using our clinic review tool, if applicable. No additional management support is needed unless otherwise documented below in the visit note. 

## 2014-04-30 NOTE — Progress Notes (Signed)
Subjective:    Patient ID: Jackie Hubbard, female    DOB: [birth year], 53 y.o.   MRN: [id number]  HPI Patient is a 53 y.o. female presenting for white spot on her throat. Pt states that she feels a little "off", which she describes as a little tired, and stated that she was having a little dryness to her throat, but it wasn't really sore, and she noticed she had to try a little harder to swallow. Her symptoms started this morning. She states that she looked in her throat and saw a white spot, so she thought she should come in. She has not had Strep throat in many years, however she does have a younger daughter at home, who frequently gets strep throat, so se thought she would come in to be seen. She was at an expo a little over a week ago in [place], and doesn't know if she might have caught something there. She has been pushing fluids, but has not tried anything else for this. She did feel slightly nauseous yesterday, however states that this resolved on its own and she is not experiencing this today. Patient denies fevers, chills, vomiting, diarrhea, shortness of breath, chest pain, headache, syncope.     Review of Systems As per HPI and are otherwise negative.    Past Medical History  Diagnosis Date  . History of shingles   . LGSIL (low grade squamous intraepithelial dysplasia) 2009  . Osteopenia 12/2013    T score -1.2 FRAX 5.7%/0.3%    History   Social History  . Marital Status: Married    Spouse Name: N/A    Number of Children: N/A  . Years of Education: N/A   Occupational History  . Not on file.   Social History Main Topics  . Smoking status: Former Research scientist (life sciences)  . Smokeless tobacco: Never Used  . Alcohol Use: 0.5 oz/week    1 drink(s) per week  . Drug Use: No  . Sexual Activity: Yes    Birth Control/ Protection: Post-menopausal   Other Topics Concern  . Not on file   Social History Narrative  . No narrative on file    Past Surgical History  Procedure  Laterality Date  . Colonoscopy  04/29/09    [place]  . Augmentation mammaplasty      Family History  Problem Relation Age of Onset  . Leukemia Mother     Allergies  Allergen Reactions  . Penicillins     No current outpatient prescriptions on file prior to visit.   No current facility-administered medications on file prior to visit.    EXAM: BP 90/60  Pulse 60  Temp(Src) 97.6 F (36.4 C) (Oral)  Resp 18  Wt 146 lb (66.225 kg)     Objective:   Physical Exam  Nursing note and vitals reviewed. Constitutional: She is oriented to person, place, and time. She appears well-developed and well-nourished. No distress.  HENT:  Head: Normocephalic and atraumatic.  Right Ear: External ear normal.  Left Ear: External ear normal.  Nose: Nose normal.  Mouth/Throat: Oropharynx is clear and moist. No oropharyngeal exudate.  Bilateral TMs normal. Bilateral frontal and maxillary sinuses non-TTP.  Eyes: Conjunctivae and EOM are normal. Pupils are equal, round, and reactive to light.  Neck: Normal range of motion. Neck supple. No tracheal deviation present. No thyromegaly present.  Cardiovascular: Normal rate, regular rhythm and intact distal pulses.   Pulmonary/Chest: Effort normal and breath sounds normal. No stridor. No respiratory distress. She exhibits no  tenderness.  Lymphadenopathy:    She has no cervical adenopathy.  Neurological: She is alert and oriented to person, place, and time.  Skin: Skin is warm and dry. No rash noted. She is not diaphoretic. No erythema. No pallor.  Psychiatric: She has a normal mood and affect. Her behavior is normal. Judgment and thought content normal.    Lab Results  Component Value Date   WBC 5.9 10/26/2013   HGB 13.8 10/26/2013   HCT 41.0 10/26/2013   PLT 231 10/26/2013   GLUCOSE 79 10/26/2013   CHOL 189 10/26/2013   TRIG 70 10/26/2013   HDL 74 10/26/2013   LDLCALC 101* 10/26/2013   ALT 19 10/26/2013   AST 20 10/26/2013   NA 138 10/26/2013   K  4.1 10/26/2013   CL 102 10/26/2013   CREATININE 0.82 10/26/2013   BUN 15 10/26/2013   CO2 25 10/26/2013   TSH 1.730 10/26/2013         Assessment & Plan:  Jackie Hubbard was seen today for white patch on throat.  Diagnoses and associated orders for this visit:  Trouble swallowing Comments: Normal exam, white spot seems to have resolved, tonislith? Watchful waiting, return if symptoms persist or worsen. - POC Rapid Strep A    Rapid strep negative. No other symptoms. Normal exam. Possibly a tonsillith, will have pt continue to monitor symptoms.  Return precautions provided.  Plan to follow up as needed, or for worsening or persistent symptoms despite treatment.  Patient Instructions  You do not have strep. Please continue to monitor your symptoms for any sign of illness.  If your difficulty swallowing persists or worsens, please call the office.  Continue to maintain fluid hydration.  If emergency symptoms discussed during visit developed, seek medical attention immediately.  Followup as needed, or for worsening or persistent symptoms despite treatment.

## 2014-04-30 NOTE — Patient Instructions (Signed)
You do not have strep. Please continue to monitor your symptoms for any sign of illness.  If your difficulty swallowing persists or worsens, please call the office.  Continue to maintain fluid hydration.  If emergency symptoms discussed during visit developed, seek medical attention immediately.  Followup as needed, or for worsening or persistent symptoms despite treatment.

## 2014-07-23 ENCOUNTER — Encounter: Payer: Self-pay | Admitting: Internal Medicine

## 2014-08-12 ENCOUNTER — Encounter: Payer: Self-pay | Admitting: Physician Assistant

## 2014-09-23 ENCOUNTER — Encounter: Payer: Self-pay | Admitting: Family Medicine

## 2014-09-23 ENCOUNTER — Ambulatory Visit (INDEPENDENT_AMBULATORY_CARE_PROVIDER_SITE_OTHER): Payer: 59 | Admitting: Family Medicine

## 2014-09-23 ENCOUNTER — Ambulatory Visit (INDEPENDENT_AMBULATORY_CARE_PROVIDER_SITE_OTHER): Payer: 59

## 2014-09-23 VITALS — BP 110/72 | HR 60 | Temp 97.9°F | Wt 146.0 lb

## 2014-09-23 DIAGNOSIS — J029 Acute pharyngitis, unspecified: Secondary | ICD-10-CM

## 2014-09-23 DIAGNOSIS — Z23 Encounter for immunization: Secondary | ICD-10-CM

## 2014-09-23 DIAGNOSIS — R1900 Intra-abdominal and pelvic swelling, mass and lump, unspecified site: Secondary | ICD-10-CM

## 2014-09-23 NOTE — Progress Notes (Signed)
Pre visit review using our clinic review tool, if applicable. No additional management support is needed unless otherwise documented below in the visit note. 

## 2014-09-23 NOTE — Progress Notes (Signed)
   Subjective:    Patient ID: Jackie Hubbard, female    DOB: 1961-05-27, 53 y.o.   MRN: [id number]  HPI Patient seen for the following 2 new items today  Sore throat off and on for several weeks and possibly several months. This does not occur any particular time of day. She has not had any appetite or weight changes. No adenopathy. She's not seen any exudate. Denies any fever or chills. She's taken anti-inflammatories which helped slightly. Denies any active postnasal drip symptoms. No recent GERD symptoms. No hoarseness. Nonsmoker.  Second issue is intermittent bulge sensation left lower abdominal region which occurs sometimes with leaning to the right side. This is never an very painful. She's been held reduce this each time. She's never had any documented hernia history. No recent change of bowel habits. This occurred once when she was doing some type of abdominal exercises.  Past Medical History  Diagnosis Date  . History of shingles   . LGSIL (low grade squamous intraepithelial dysplasia) 2009  . Osteopenia 12/2013    T score -1.2 FRAX 5.7%/0.3%   Past Surgical History  Procedure Laterality Date  . Colonoscopy  04/29/09    [place]  . Augmentation mammaplasty      reports that she has quit smoking. She has never used smokeless tobacco. She reports that she drinks about 0.5 oz of alcohol per week. She reports that she does not use illicit drugs. family history includes Leukemia in her mother. Allergies  Allergen Reactions  . Penicillins       Review of Systems  Constitutional: Negative for fever and appetite change.  HENT: Positive for sore throat. Negative for congestion, ear pain, postnasal drip, trouble swallowing and voice change.   Respiratory: Negative for shortness of breath.   Cardiovascular: Negative for chest pain.  Gastrointestinal: Negative for nausea, vomiting, abdominal pain and diarrhea.  Hematological: Negative for adenopathy.       Objective:   Physical Exam  Constitutional: She appears well-developed and well-nourished.  HENT:  Right Ear: External ear normal.  Left Ear: External ear normal.  Mouth/Throat: Oropharynx is clear and moist.  Neck: Neck supple. No thyromegaly present.  Cardiovascular: Normal rate and regular rhythm.   Pulmonary/Chest: Effort normal and breath sounds normal. No respiratory distress. She has no wheezes. She has no rales.  Abdominal: Soft. Bowel sounds are normal. She exhibits no distension and no mass. There is no tenderness. There is no rebound and no guarding.  Lymphadenopathy:    She has no cervical adenopathy.          Assessment & Plan:  #1 probable hernia left lower abdomen. We cannot appreciate any definite hernial exam today this is very suspicious by history. We discussed options including surgical referral or possible CT abdomen/pelvis but at this point she is not have any pain and wishes to observe. Review signs and symptoms of strangulation. #2 persistent sore throat. She does not have any chronic sinusitis, postnasal drip, or active GERD symptoms. She does not have any red flags such as appetite or weight changes or any adenopathy. We recommended saltwater gargles and symptomatic treatment and if this persist consider ENT referral. She has unremarkable exam

## 2014-09-23 NOTE — Patient Instructions (Signed)
Follow up for any fever, persistent lymphadenopathy or any appetite or weight changes.

## 2014-11-18 ENCOUNTER — Telehealth: Payer: Self-pay | Admitting: Family Medicine

## 2014-11-18 NOTE — Telephone Encounter (Signed)
Pt is having a laser hair removal procedure. The cream is to help numb the area.

## 2014-11-18 NOTE — Telephone Encounter (Signed)
Pt called to ask if Dr Elease Hashimoto will call her in some numbing cream she is having hair removal procedure. If he orders it her insurance will pay     [place]

## 2014-11-18 NOTE — Telephone Encounter (Signed)
Please be more specific what she is requiring.

## 2014-11-18 NOTE — Telephone Encounter (Signed)
Which cream is she referring to?

## 2014-11-19 ENCOUNTER — Telehealth: Payer: Self-pay | Admitting: Family Medicine

## 2014-11-19 NOTE — Telephone Encounter (Signed)
Left message for patient to return call with information on the cream.

## 2014-11-19 NOTE — Telephone Encounter (Signed)
Pt called to say that the cream is 6 % benzocaine

## 2014-11-20 NOTE — Telephone Encounter (Signed)
Is it okay to prescribe Lidocaine. benozocaine is no longer carried.

## 2014-11-20 NOTE — Telephone Encounter (Signed)
Is it okay to send Rx.

## 2014-11-20 NOTE — Telephone Encounter (Signed)
yes

## 2014-11-21 MED ORDER — LIDOCAINE 4 % EX CREA: 1.0000 "application " | TOPICAL_CREAM | CUTANEOUS | Status: DC | PRN

## 2014-11-21 NOTE — Addendum Note (Signed)
Addended by: Marcina Millard on: 11/21/2014 10:33 AM   Modules accepted: Orders, Medications

## 2014-11-21 NOTE — Telephone Encounter (Signed)
Rx sent to pharmacy and Pt is aware 

## 2015-10-29 ENCOUNTER — Other Ambulatory Visit (INDEPENDENT_AMBULATORY_CARE_PROVIDER_SITE_OTHER): Payer: 59

## 2015-10-29 DIAGNOSIS — Z0000 Encounter for general adult medical examination without abnormal findings: Secondary | ICD-10-CM

## 2015-10-29 LAB — HEPATIC FUNCTION PANEL
ALT: 17 U/L (ref 0–35)
AST: 22 U/L (ref 0–37)
Albumin: 4.5 g/dL (ref 3.5–5.2)
Alkaline Phosphatase: 62 U/L (ref 39–117)
BILIRUBIN DIRECT: 0.1 mg/dL (ref 0.0–0.3)
TOTAL PROTEIN: 7.4 g/dL (ref 6.0–8.3)
Total Bilirubin: 0.6 mg/dL (ref 0.2–1.2)

## 2015-10-29 LAB — CBC WITH DIFFERENTIAL/PLATELET
BASOS PCT: 1.1 % (ref 0.0–3.0)
Basophils Absolute: 0.1 10*3/uL (ref 0.0–0.1)
EOS PCT: 1.7 % (ref 0.0–5.0)
Eosinophils Absolute: 0.1 10*3/uL (ref 0.0–0.7)
HEMATOCRIT: 44 % (ref 36.0–46.0)
HEMOGLOBIN: 14.6 g/dL (ref 12.0–15.0)
LYMPHS PCT: 27.1 % (ref 12.0–46.0)
Lymphs Abs: 1.8 10*3/uL (ref 0.7–4.0)
MCHC: 33.2 g/dL (ref 30.0–36.0)
MCV: 91.4 fl (ref 78.0–100.0)
Monocytes Absolute: 0.6 10*3/uL (ref 0.1–1.0)
Monocytes Relative: 8.3 % (ref 3.0–12.0)
Neutro Abs: 4.1 10*3/uL (ref 1.4–7.7)
Neutrophils Relative %: 61.8 % (ref 43.0–77.0)
Platelets: 206 10*3/uL (ref 150.0–400.0)
RBC: 4.81 Mil/uL (ref 3.87–5.11)
RDW: 13.1 % (ref 11.5–15.5)
WBC: 6.7 10*3/uL (ref 4.0–10.5)

## 2015-10-29 LAB — BASIC METABOLIC PANEL
BUN: 21 mg/dL (ref 6–23)
CO2: 26 meq/L (ref 19–32)
Calcium: 9.8 mg/dL (ref 8.4–10.5)
Chloride: 103 mEq/L (ref 96–112)
Creatinine, Ser: 0.96 mg/dL (ref 0.40–1.20)
GFR: 63.71 mL/min (ref >60.00)
GLUCOSE: 86 mg/dL (ref 70–99)
POTASSIUM: 4.7 meq/L (ref 3.5–5.1)
Sodium: 139 mEq/L (ref 135–145)

## 2015-10-29 LAB — TSH: TSH: 1.45 u[IU]/mL (ref 0.35–4.50)

## 2015-10-29 LAB — LIPID PANEL
CHOLESTEROL: 225 mg/dL — AB (ref 0–200)
HDL: 91.9 mg/dL (ref >39.00)
LDL CALC: 121 mg/dL — AB (ref 0–99)
NONHDL: 132.6
Total CHOL/HDL Ratio: 2
Triglycerides: 58 mg/dL (ref 0.0–149.0)
VLDL: 11.6 mg/dL (ref 0.0–40.0)

## 2015-10-30 ENCOUNTER — Ambulatory Visit (INDEPENDENT_AMBULATORY_CARE_PROVIDER_SITE_OTHER): Payer: 59 | Admitting: Family Medicine

## 2015-10-30 ENCOUNTER — Encounter: Payer: Self-pay | Admitting: Family Medicine

## 2015-10-30 VITALS — BP 110/80 | HR 65 | Temp 97.8°F | Ht 63.0 in | Wt 146.0 lb

## 2015-10-30 DIAGNOSIS — Z0000 Encounter for general adult medical examination without abnormal findings: Secondary | ICD-10-CM | POA: Diagnosis not present

## 2015-10-30 NOTE — Progress Notes (Signed)
Pre visit review using our clinic review tool, if applicable. No additional management support is needed unless otherwise documented below in the visit note. 

## 2015-10-30 NOTE — Progress Notes (Signed)
Subjective:    Patient ID: Jackie Hubbard, female    DOB: [birth year], 55 y.o.   MRN: [id number]  HPI Patient here for physical. She sees gynecologist regularly. She is due for repeat mammogram and plans to set that up. Tetanus up-to-date. Colonoscopy up-to-date. Nonsmoker. Exercises daily. She relates that she's had some recent reflux symptoms and taking frequent tums which helps. Denies any dysphagia. No appetite or weight changes. No pain with swallowing  2 week history of some mild intermittent discomfort right pelvic area. Not exacerbated by movement. No vaginal spotting. Nontender at this time. No pain with running her ambulation. No fevers or chills. No diarrhea or stool changes.  Past Medical History  Diagnosis Date  . History of shingles   . LGSIL (low grade squamous intraepithelial dysplasia) 2009  . Osteopenia 12/2013    T score -1.2 FRAX 5.7%/0.3%   Past Surgical History  Procedure Laterality Date  . Colonoscopy  04/29/09    [place]  . Augmentation mammaplasty      reports that she has quit smoking. She has never used smokeless tobacco. She reports that she drinks about 0.5 oz of alcohol per week. She reports that she does not use illicit drugs. family history includes Arthritis in her father; Leukemia in her mother. Allergies  Allergen Reactions  . Penicillins       Review of Systems  Constitutional: Negative for fever, activity change, appetite change, fatigue and unexpected weight change.  HENT: Negative for ear pain, hearing loss, sore throat and trouble swallowing.   Eyes: Negative for visual disturbance.  Respiratory: Negative for cough and shortness of breath.   Cardiovascular: Negative for chest pain and palpitations.  Gastrointestinal: Negative for nausea, vomiting, diarrhea, constipation and blood in stool.  Genitourinary: Negative for dysuria and hematuria.  Musculoskeletal: Negative for myalgias, back pain and arthralgias.  Skin: Negative for rash.    Neurological: Negative for dizziness, syncope and headaches.  Hematological: Negative for adenopathy.  Psychiatric/Behavioral: Negative for confusion and dysphoric mood.       Objective:   Physical Exam  Constitutional: She is oriented to person, place, and time. She appears well-developed and well-nourished.  HENT:  Head: Normocephalic and atraumatic.  Eyes: EOM are normal. Pupils are equal, round, and reactive to light.  Neck: Normal range of motion. Neck supple. No thyromegaly present.  Cardiovascular: Normal rate, regular rhythm and normal heart sounds.   No murmur heard. Pulmonary/Chest: Breath sounds normal. No respiratory distress. She has no wheezes. She has no rales.  Abdominal: Soft. Bowel sounds are normal. She exhibits no distension and no mass. There is no tenderness. There is no rebound and no guarding.  Genitourinary:  Per GYN  Musculoskeletal: Normal range of motion. She exhibits no edema.  She has some mild crepitus with flexion-extension of left knee. No knee effusion  Lymphadenopathy:    She has no cervical adenopathy.  Neurological: She is alert and oriented to person, place, and time. She displays normal reflexes. No cranial nerve deficit.  Skin: No rash noted.  Psychiatric: She has a normal mood and affect. Her behavior is normal. Judgment and thought content normal.          Assessment & Plan:  Physical exam. Labs reviewed. She has excellent HDL. No worrisome abnormalities. We discussed lifestyle management of GERD. She will consider over-the-counter Zantac or Pepcid for as needed use. We'll encouraged her to follow-up with her gynecologist regarding her right pelvic pain which has gone on for about 2 weeks  now and somewhat intermittent. She agrees.

## 2015-10-30 NOTE — Patient Instructions (Signed)
Food Choices for Gastroesophageal Reflux Disease, Adult When you have gastroesophageal reflux disease (GERD), the foods you eat and your eating habits are very important. Choosing the right foods can help ease the discomfort of GERD. WHAT GENERAL GUIDELINES DO I NEED TO FOLLOW?  Choose fruits, vegetables, whole grains, low-fat dairy products, and low-fat meat, fish, and poultry.  Limit fats such as oils, salad dressings, butter, nuts, and avocado.  Keep a food diary to identify foods that cause symptoms.  Avoid foods that cause reflux. These may be different for different people.  Eat frequent small meals instead of three large meals each day.  Eat your meals slowly, in a relaxed setting.  Limit fried foods.  Cook foods using methods other than frying.  Avoid drinking alcohol.  Avoid drinking large amounts of liquids with your meals.  Avoid bending over or lying down until 2-3 hours after eating. WHAT FOODS ARE NOT RECOMMENDED? The following are some foods and drinks that may worsen your symptoms: Vegetables Tomatoes. Tomato juice. Tomato and spaghetti sauce. Chili peppers. Onion and garlic. Horseradish. Fruits Oranges, grapefruit, and lemon (fruit and juice). Meats High-fat meats, fish, and poultry. This includes hot dogs, ribs, ham, sausage, salami, and bacon. Dairy Whole milk and chocolate milk. Sour cream. Cream. Butter. Ice cream. Cream cheese.  Beverages Coffee and tea, with or without caffeine. Carbonated beverages or energy drinks. Condiments Hot sauce. Barbecue sauce.  Sweets/Desserts Chocolate and cocoa. Donuts. Peppermint and spearmint. Fats and Oils High-fat foods, including French fries and potato chips. Other Vinegar. Strong spices, such as black pepper, white pepper, red pepper, cayenne, curry powder, cloves, ginger, and chili powder. The items listed above may not be a complete list of foods and beverages to avoid. Contact your dietitian for more  information.   This information is not intended to replace advice given to you by your health care provider. Make sure you discuss any questions you have with your health care provider.   Document Released: 09/27/2005 Document Revised: 10/18/2014 Document Reviewed: 08/01/2013 Elsevier Interactive Patient Education 2016 [employer]  

## 2015-12-18 ENCOUNTER — Ambulatory Visit (INDEPENDENT_AMBULATORY_CARE_PROVIDER_SITE_OTHER): Payer: Managed Care, Other (non HMO) | Admitting: Family Medicine

## 2015-12-18 VITALS — BP 100/80 | HR 76 | Temp 98.1°F | Ht 63.0 in | Wt 146.0 lb

## 2015-12-18 DIAGNOSIS — R05 Cough: Secondary | ICD-10-CM | POA: Diagnosis not present

## 2015-12-18 DIAGNOSIS — R059 Cough, unspecified: Secondary | ICD-10-CM

## 2015-12-18 MED ORDER — HYDROCODONE-HOMATROPINE 5-1.5 MG/5ML PO SYRP: 5.0000 mL | ORAL_SOLUTION | Freq: Four times a day (QID) | ORAL | Status: AC | PRN

## 2015-12-18 NOTE — Progress Notes (Signed)
Pre visit review using our clinic review tool, if applicable. No additional management support is needed unless otherwise documented below in the visit note. 

## 2015-12-18 NOTE — Patient Instructions (Signed)
Let me know if cough not better in 2 weeks.

## 2015-12-18 NOTE — Progress Notes (Signed)
   Subjective:    Patient ID: Jackie Hubbard, female    DOB: 05/06/1961, 55 y.o.   MRN: [id number]  HPI Acute visit for cough. Patient is a nonsmoker with no chronic lung problems. About 5 weeks ago she described flulike illness with fever. Acute symptoms resolved and now she has mostly nonproductive cough. Cough worse at night. No recurrent fever. No hemoptysis. No appetite or weight changes. No obvious wheezing. Still exercising. Denies any GERD symptoms. No postnasal drip symptoms. She's tried Mucinex and Delsym without relief.  Past Medical History  Diagnosis Date  . History of shingles   . LGSIL (low grade squamous intraepithelial dysplasia) 2009  . Osteopenia 12/2013    T score -1.2 FRAX 5.7%/0.3%   Past Surgical History  Procedure Laterality Date  . Colonoscopy  04/29/09      . Augmentation mammaplasty      reports that she has quit smoking. She has never used smokeless tobacco. She reports that she drinks about 0.5 oz of alcohol per week. She reports that she does not use illicit drugs. family history includes Arthritis in her father; Leukemia in her mother. Allergies  Allergen Reactions  . Penicillins       Review of Systems  Constitutional: Negative for fever and chills.  HENT: Negative for congestion and postnasal drip.   Respiratory: Positive for cough. Negative for shortness of breath and wheezing.   Cardiovascular: Negative for chest pain.       Objective:   Physical Exam  Constitutional: She appears well-developed and well-nourished.  HENT:  Right Ear: External ear normal.  Left Ear: External ear normal.  Mouth/Throat: Oropharynx is clear and moist.  Neck: Neck supple.  Cardiovascular: Normal rate and regular rhythm.   Pulmonary/Chest: Effort normal and breath sounds normal. No respiratory distress. She has no wheezes. She has no rales.          Assessment & Plan:  Persistent cough. Nonfocal exam. Suspect post viral bronchitis. Not  relieved with over-the-counter medications. Hycodan cough syrup 1 teaspoon daily at bedtime. Touch base by next week if cough not improving

## 2016-03-30 ENCOUNTER — Ambulatory Visit (INDEPENDENT_AMBULATORY_CARE_PROVIDER_SITE_OTHER): Payer: 59 | Admitting: Gynecology

## 2016-03-30 ENCOUNTER — Encounter: Payer: Self-pay | Admitting: Gynecology

## 2016-03-30 VITALS — BP 118/78 | Ht 63.0 in | Wt 143.0 lb

## 2016-03-30 DIAGNOSIS — M8580 Other specified disorders of bone density and structure, unspecified site: Secondary | ICD-10-CM

## 2016-03-30 DIAGNOSIS — Z1329 Encounter for screening for other suspected endocrine disorder: Secondary | ICD-10-CM

## 2016-03-30 DIAGNOSIS — Z01419 Encounter for gynecological examination (general) (routine) without abnormal findings: Secondary | ICD-10-CM | POA: Diagnosis not present

## 2016-03-30 DIAGNOSIS — Z13220 Encounter for screening for lipoid disorders: Secondary | ICD-10-CM | POA: Diagnosis not present

## 2016-03-30 NOTE — Patient Instructions (Signed)

## 2016-03-30 NOTE — Progress Notes (Signed)
    Jackie Hubbard 1961-04-23 [id number]        55 y.o.  G3P3  for annual exam.  Doing well without GYN complaints.  Past medical history,surgical history, problem list, medications, allergies, family history and social history were all reviewed and documented as reviewed in the EPIC chart.  ROS:  Performed with pertinent positives and negatives included in the history, assessment and plan.   Additional significant findings :  None   Exam: [name] assistant Filed Vitals:   03/30/16 1533  BP: 118/78  Height: 5\' 3"  (1.6 m)  Weight: 143 lb (64.864 kg)   General appearance:  Normal affect, orientation and appearance. Skin: Grossly normal HEENT: Without gross lesions.  No cervical or supraclavicular adenopathy. Thyroid normal.  Lungs:  Clear without wheezing, rales or rhonchi Cardiac: RR, without RMG Abdominal:  Soft, nontender, without masses, guarding, rebound, organomegaly or hernia Breasts:  Examined lying and sitting without masses, retractions, discharge or axillary adenopathy.Bilateral implants noted   Pelvic:  Ext/BUS/vagina normal with mild atrophic changes   Cervix normal. Pap smear done   Uterus anteverted , normal size, shape and contour, midline and mobile nontender   Adnexa without masses or tenderness    Anus and perineum normal   Rectovaginal normal sphincter tone without palpated masses or tenderness.    Assessment/Plan:  55 y.o. G3P3 female for annual exam.   1. Post menopausal. Without significant hot flushes, night sweats, vaginal dryness or any vaginal bleeding. Continue monitor reporting issues or vaginal bleeding. 2. Osteopenia. DEXA 12/2013 T score -1.2 FRAX 5.7%/0.3%. Discussed when to repeat the study. We'll plan in another year or 2. Check vitamin D level today. 3. Overdue for mammogram with last study 08/2013. Patient agrees to call to schedule. SBE monthly reviewed. 4. Colonoscopy 2010 with reported repeat interval 10 years. 5. Pap smear/HPV  10/2013.  Pap smear done today. History LGSIL 2009. 6. Health maintenance.  Had routine blood work done in January at Dr. Erick Blinks office. Check vitamin D level today and urinalysis. Follow up in one year, sooner as needed.   Anastasio Auerbach MD, 3:58 PM 03/30/2016

## 2016-03-30 NOTE — Addendum Note (Signed)
Addended by: Nelva Nay on: 03/30/2016 04:18 PM   Modules accepted: Orders, SmartSet

## 2016-03-31 LAB — URINALYSIS W MICROSCOPIC + REFLEX CULTURE
Bacteria, UA: NONE SEEN [HPF]
Bilirubin Urine: NEGATIVE
Casts: NONE SEEN [LPF]
Crystals: NONE SEEN [HPF]
GLUCOSE, UA: NEGATIVE
HGB URINE DIPSTICK: NEGATIVE
KETONES UR: NEGATIVE
NITRITE: NEGATIVE
PH: 7.5 (ref 5.0–8.0)
Protein, ur: NEGATIVE
RBC / HPF: NONE SEEN RBC/HPF (ref <=2)
SQUAMOUS EPITHELIAL / LPF: NONE SEEN [HPF] (ref <=5)
Specific Gravity, Urine: 1.011 (ref 1.001–1.035)
YEAST: NONE SEEN [HPF]

## 2016-03-31 LAB — PAP IG W/ RFLX HPV ASCU

## 2016-03-31 LAB — VITAMIN D 25 HYDROXY (VIT D DEFICIENCY, FRACTURES): Vit D, 25-Hydroxy: 40 ng/mL (ref 30–100)

## 2016-04-01 LAB — URINE CULTURE
Colony Count: NO GROWTH
ORGANISM ID, BACTERIA: NO GROWTH

## 2016-04-09 ENCOUNTER — Telehealth: Payer: Self-pay | Admitting: Family Medicine

## 2016-04-09 NOTE — Telephone Encounter (Signed)
[facility] Day - Client [place] [facility] Patient Name: [facility] DOB: July 30, 1961 Initial Comment Fell yesterday, hit right arm. arm hurts and can't extended it, cant move arm up to touch her nose, elbow is very painful Nurse Assessment Nurse: Dimas Chyle, RN, Dellis Filbert Date/Time Eilene Ghazi Time): 04/09/2016 2:27:39 PM Confirm and document reason for call. If symptomatic, describe symptoms. You must click the next button to save text entered. ---[place] yesterday, hit right arm. arm hurts and can't extended it, cant move arm up to touch her nose, elbow is very painful. Arm is slightly swollen. No bruising. Has the patient traveled out of the country within the last 30 days? ---No Does the patient have any new or worsening symptoms? ---Yes Will a triage be completed? ---Yes Related visit to physician within the last 2 weeks? ---N/A Does the PT have any chronic conditions? (i.e. diabetes, asthma, etc.) ---No Is this a behavioral health or substance abuse call? ---No Guidelines Guideline Title Affirmed Question Affirmed Notes Elbow Injury Can't bend injured elbow at all Final Disposition User Go to ED Now Dimas Chyle, RN, Dellis Filbert Comments Caller was referred to [facility] because she was not going to be back in town until 4:15. UC was going to be open until 6 p.m. Referrals [facility] - UC Disagree/Comply: Comply

## 2016-06-01 ENCOUNTER — Other Ambulatory Visit: Payer: Self-pay | Admitting: Gynecology

## 2016-06-01 DIAGNOSIS — Z1231 Encounter for screening mammogram for malignant neoplasm of breast: Secondary | ICD-10-CM

## 2016-06-10 ENCOUNTER — Ambulatory Visit: Payer: Managed Care, Other (non HMO)

## 2016-06-11 ENCOUNTER — Ambulatory Visit
Admission: RE | Admit: 2016-06-11 | Discharge: 2016-06-11 | Disposition: A | Discharge status: Home / Self Care | Payer: 59 | Source: Ambulatory Visit | Attending: Gynecology | Admitting: Gynecology

## 2016-06-11 DIAGNOSIS — Z1231 Encounter for screening mammogram for malignant neoplasm of breast: Secondary | ICD-10-CM

## 2016-07-30 ENCOUNTER — Encounter: Payer: Self-pay | Admitting: Gynecology

## 2016-07-30 ENCOUNTER — Ambulatory Visit (INDEPENDENT_AMBULATORY_CARE_PROVIDER_SITE_OTHER): Payer: 59 | Admitting: Gynecology

## 2016-07-30 VITALS — BP 120/76

## 2016-07-30 DIAGNOSIS — N898 Other specified noninflammatory disorders of vagina: Secondary | ICD-10-CM | POA: Diagnosis not present

## 2016-07-30 LAB — WET PREP FOR TRICH, YEAST, CLUE
CLUE CELLS WET PREP: NONE SEEN
TRICH WET PREP: NONE SEEN
Yeast Wet Prep HPF POC: NONE SEEN

## 2016-07-30 MED ORDER — METRONIDAZOLE 500 MG PO TABS: 500.0000 mg | ORAL_TABLET | Freq: Two times a day (BID) | ORAL | 0 refills | Status: DC

## 2016-07-30 NOTE — Progress Notes (Signed)
    Jackie Hubbard 1961/09/02 [id number]        55 y.o.  G3P3 presents with one-month history of vaginal discharge. No odor, itching or irritation.  No urinary symptoms such as frequency, urgency, dysuria, low back pain, fever or chills.  Past medical history,surgical history, problem list, medications, allergies, family history and social history were all reviewed and documented in the EPIC chart.  Directed ROS with pertinent positives and negatives documented in the history of present illness/assessment and plan.  Exam: [name] assistant Vitals:   07/30/16 0835  BP: 120/76   General appearance:  Normal Abdomen soft nontender without masses guarding rebound Pelvic external BUS vagina normal. Scant discharge noted. Cervix normal. Uterus normal size midline mobile nontender. Adnexa without masses or tenderness.  Assessment/Plan:  55 y.o. G3P3 with above history and exam. Wet prep is unimpressive. Question whether she has a low-grade bacterial vaginosis. Will cover with Flagyl 500 mg 3 times a day 7 days. Alcohol avoidance reviewed. Follow up if symptoms persist, worsen or recur.    Anastasio Auerbach MD, 8:46 AM 07/30/2016

## 2016-07-30 NOTE — Patient Instructions (Signed)
Take the Flagyl medication twice daily for 7 days.  Avoid alcohol while taking.  Follow-up if your symptoms persist, worsen or recur. 

## 2016-11-08 ENCOUNTER — Ambulatory Visit (INDEPENDENT_AMBULATORY_CARE_PROVIDER_SITE_OTHER): Payer: 59 | Admitting: Family Medicine

## 2016-11-08 VITALS — BP 120/90 | HR 48

## 2016-11-08 DIAGNOSIS — M62830 Muscle spasm of back: Secondary | ICD-10-CM

## 2016-11-08 MED ORDER — METHOCARBAMOL 500 MG PO TABS: 500.0000 mg | ORAL_TABLET | Freq: Four times a day (QID) | ORAL | 1 refills | Status: DC | PRN

## 2016-11-08 NOTE — Progress Notes (Signed)
Pre visit review using our clinic review tool, if applicable. No additional management support is needed unless otherwise documented below in the visit note. 

## 2016-11-08 NOTE — Patient Instructions (Signed)
Low Back Sprain Rehab  Ask your health care provider which exercises are safe for you. Do exercises exactly as told by your health care provider and adjust them as directed. It is normal to feel mild stretching, pulling, tightness, or discomfort as you do these exercises, but you should stop right away if you feel sudden pain or your pain gets worse. Do not begin these exercises until told by your health care provider.  Stretching and range of motion exercises  These exercises warm up your muscles and joints and improve the movement and flexibility of your back. These exercises also help to relieve pain, numbness, and tingling.  Exercise A: Lumbar rotation     1. Lie on your back on a firm surface and bend your knees.  2. Straighten your arms out to your sides so each arm forms an "L" shape with a side of your body (a 90 degree angle).  3. Slowly move both of your knees to one side of your body until you feel a stretch in your lower back. Try not to let your shoulders move off of the floor.  4. Hold for __________ seconds.  5. Tense your abdominal muscles and slowly move your knees back to the starting position.  6. Repeat this exercise on the other side of your body.  Repeat __________ times. Complete this exercise __________ times a day.  Exercise B: Prone extension on elbows     1. Lie on your abdomen on a firm surface.  2. Prop yourself up on your elbows.  3. Use your arms to help lift your chest up until you feel a gentle stretch in your abdomen and your lower back.  ? This will place some of your body weight on your elbows. If this is uncomfortable, try stacking pillows under your chest.  ? Your hips should stay down, against the surface that you are lying on. Keep your hip and back muscles relaxed.  4. Hold for __________ seconds.  5. Slowly relax your upper body and return to the starting position.  Repeat __________ times. Complete this exercise __________ times a day.  Strengthening exercises  These  exercises build strength and endurance in your back. Endurance is the ability to use your muscles for a long time, even after they get tired.  Exercise C: Pelvic tilt   1. Lie on your back on a firm surface. Bend your knees and keep your feet flat.  2. Tense your abdominal muscles. Tip your pelvis up toward the ceiling and flatten your lower back into the floor.  ? To help with this exercise, you may place a small towel under your lower back and try to push your back into the towel.  3. Hold for __________ seconds.  4. Let your muscles relax completely before you repeat this exercise.  Repeat __________ times. Complete this exercise __________ times a day.  Exercise D: Alternating arm and leg raises     1. Get on your hands and knees on a firm surface. If you are on a hard floor, you may want to use padding to cushion your knees, such as an exercise mat.  2. Line up your arms and legs. Your hands should be below your shoulders, and your knees should be below your hips.  3. Lift your left leg behind you. At the same time, raise your right arm and straighten it in front of you.  ? Do not lift your leg higher than your hip.  ? Do   not lift your arm higher than your shoulder.  ? Keep your abdominal and back muscles tight.  ? Keep your hips facing the ground.  ? Do not arch your back.  ? Keep your balance carefully, and do not hold your breath.  4. Hold for __________ seconds.  5. Slowly return to the starting position and repeat with your right leg and your left arm.  Repeat __________ times. Complete this exercise __________ times a day.  Exercise E: Abdominal set with straight leg raise     1. Lie on your back on a firm surface.  2. Bend one of your knees and keep your other leg straight.  3. Tense your abdominal muscles and lift your straight leg up, 4-6 inches (10-15 cm) off the ground.  4. Keep your abdominal muscles tight and hold for __________ seconds.  ? Do not hold your breath.  ? Do not arch your back. Keep it  flat against the ground.  5. Keep your abdominal muscles tense as you slowly lower your leg back to the starting position.  6. Repeat with your other leg.  Repeat __________ times. Complete this exercise __________ times a day.  Posture and body mechanics     Body mechanics refers to the movements and positions of your body while you do your daily activities. Posture is part of body mechanics. Good posture and healthy body mechanics can help to relieve stress in your body's tissues and joints. Good posture means that your spine is in its natural S-curve position (your spine is neutral), your shoulders are pulled back slightly, and your head is not tipped forward. The following are general guidelines for applying improved posture and body mechanics to your everyday activities.  Standing     · When standing, keep your spine neutral and your feet about hip-width apart. Keep a slight bend in your knees. Your ears, shoulders, and hips should line up.  · When you do a task in which you stand in one place for a long time, place one foot up on a stable object that is 2-4 inches (5-10 cm) high, such as a footstool. This helps keep your spine neutral.  Sitting     · When sitting, keep your spine neutral and keep your feet flat on the floor. Use a footrest, if necessary, and keep your thighs parallel to the floor. Avoid rounding your shoulders, and avoid tilting your head forward.  · When working at a desk or a computer, keep your desk at a height where your hands are slightly lower than your elbows. Slide your chair under your desk so you are close enough to maintain good posture.  · When working at a computer, place your monitor at a height where you are looking straight ahead and you do not have to tilt your head forward or downward to look at the screen.  Resting     · When lying down and resting, avoid positions that are most painful for you.  · If you have pain with activities such as sitting, bending, stooping, or  squatting (flexion-based activities), lie in a position in which your body does not bend very much. For example, avoid curling up on your side with your arms and knees near your chest (fetal position).  · If you have pain with activities such as standing for a long time or reaching with your arms (extension-based activities), lie with your spine in a neutral position and bend your knees slightly. Try the following   positions:  · Lying on your side with a pillow between your knees.  · Lying on your back with a pillow under your knees.  Lifting     · When lifting objects, keep your feet at least shoulder-width apart and tighten your abdominal muscles.  · Bend your knees and hips and keep your spine neutral. It is important to lift using the strength of your legs, not your back. Do not lock your knees straight out.  · Always ask for help to lift heavy or awkward objects.  This information is not intended to replace advice given to you by your health care provider. Make sure you discuss any questions you have with your health care provider.  Document Released: 09/27/2005 Document Revised: 06/03/2016 Document Reviewed: 07/09/2015  Elsevier Interactive Patient Education © 2017 [employer]

## 2016-11-08 NOTE — Progress Notes (Signed)
Subjective:     Patient ID: Jackie Hubbard, female   DOB: May 09, 1961, 56 y.o.   MRN: [id number]  HPI Patient seen with low back pain. She was doing a rowing exercise yesterday and noticed a popping sensation left lower lumbar region. She had some immediate pain but mostly has had some spasms since then. Her spasms are triggered by movement. She tried ice and Tylenol without much improvement. She had leftover diclofenac and took that without much improvement. She does not have much pain at rest but only when she has spasm. Denies any lower extremity weakness or numbness. No loss of urine or stool control.  Past Medical History:  Diagnosis Date  . History of shingles   . LGSIL (low grade squamous intraepithelial dysplasia) 2009  . Osteopenia 12/2013   T score -1.2 FRAX 5.7%/0.3%   Past Surgical History:  Procedure Laterality Date  . AUGMENTATION MAMMAPLASTY    . BLADDER SUSPENSION    . COLONOSCOPY  04/29/09   [place]    reports that she has quit smoking. She has never used smokeless tobacco. She reports that she drinks about 0.6 oz of alcohol per week . She reports that she does not use drugs. family history includes Arthritis in her father; Leukemia in her mother. Allergies  Allergen Reactions  . Penicillins      Review of Systems  Constitutional: Negative for appetite change, chills, fever and unexpected weight change.  Gastrointestinal: Negative for abdominal pain.  Genitourinary: Negative for dysuria.  Musculoskeletal: Positive for back pain.  Neurological: Negative for weakness and numbness.       Objective:   Physical Exam  Constitutional: She appears well-developed and well-nourished.  Cardiovascular: Normal rate and regular rhythm.   Pulmonary/Chest: Effort normal and breath sounds normal. No respiratory distress. She has no wheezes. She has no rales.  Musculoskeletal:  Straight leg raises are negative bilaterally No spinal tenderness  Neurological:  Full strength  with plantarflexion, dorsiflexion, and knee extension. She has symmetric reflexes.       Assessment:     Left lower lumbar back pain with spasm. Nonfocal exam    Plan:     -Continue heat and stretches -Continue Tylenol and/or diclofenac as needed -Robaxin 500 milligrams every 6 hours as needed for muscle spasm  Eulas Post MD [facility]

## 2017-08-23 ENCOUNTER — Telehealth: Payer: Self-pay | Admitting: Family Medicine

## 2017-08-23 MED ORDER — METHOCARBAMOL 500 MG PO TABS: 500.0000 mg | ORAL_TABLET | Freq: Four times a day (QID) | ORAL | 0 refills | Status: DC | PRN

## 2017-08-23 NOTE — Telephone Encounter (Signed)
Pt called back asking for refill of Methocarbamol due to having back spasms. Pt has already used previous refill and is down to the last 4 pills.

## 2017-08-23 NOTE — Telephone Encounter (Signed)
Refill once OK. 

## 2017-08-23 NOTE — Telephone Encounter (Signed)
Left message to call back regarding: Request for refill for Robaxin.

## 2017-08-23 NOTE — Telephone Encounter (Signed)
Refill sent and patient is aware. 

## 2017-08-23 NOTE — Telephone Encounter (Signed)
Copied from [place] [id number]. Topic: Quick Communication - See Telephone Encounter >> Aug 23, 2017 12:00 PM Robina Ade, Helene Kelp D wrote: CRM for notification. See Telephone encounter for: 08/23/17. Patient called requesting refill on her muscle relaxer methocarbamol 500 mg sent to her pharmacy.

## 2017-10-31 ENCOUNTER — Other Ambulatory Visit: Payer: Self-pay | Admitting: Gynecology

## 2017-10-31 DIAGNOSIS — Z1231 Encounter for screening mammogram for malignant neoplasm of breast: Secondary | ICD-10-CM

## 2017-11-09 ENCOUNTER — Ambulatory Visit: Payer: 59 | Admitting: Family Medicine

## 2017-11-09 DIAGNOSIS — Z0289 Encounter for other administrative examinations: Secondary | ICD-10-CM

## 2017-11-17 ENCOUNTER — Ambulatory Visit
Admission: RE | Admit: 2017-11-17 | Discharge: 2017-11-17 | Disposition: A | Discharge status: Home / Self Care | Payer: 59 | Source: Ambulatory Visit | Attending: Gynecology | Admitting: Gynecology

## 2017-11-17 DIAGNOSIS — Z1231 Encounter for screening mammogram for malignant neoplasm of breast: Secondary | ICD-10-CM

## 2018-08-11 ENCOUNTER — Other Ambulatory Visit: Payer: Self-pay

## 2018-08-11 ENCOUNTER — Encounter: Payer: Self-pay | Admitting: Family Medicine

## 2018-08-11 ENCOUNTER — Ambulatory Visit (INDEPENDENT_AMBULATORY_CARE_PROVIDER_SITE_OTHER): Payer: 59 | Admitting: Family Medicine

## 2018-08-11 VITALS — BP 110/72 | HR 56 | Temp 98.1°F | Wt 149.6 lb

## 2018-08-11 DIAGNOSIS — R1084 Generalized abdominal pain: Secondary | ICD-10-CM

## 2018-08-11 DIAGNOSIS — R140 Abdominal distension (gaseous): Secondary | ICD-10-CM | POA: Diagnosis not present

## 2018-08-11 NOTE — Progress Notes (Signed)
  Subjective:     Patient ID: Jackie Hubbard, female   DOB: 1961/01/28, 57 y.o.   MRN: [id number]  HPI Patient seen with frequent abdominal bloating type symptoms over the past year or so possibly longer.  She has noted with specific food items such as ice cream and whipped cream she has severe bloating usually within minutes.  She does not have any history of lactose intolerance and has generally not seen these symptoms with other dairy products such as cheese or milk.  She also uses heavy creamer with her coffee and has not seen a correlation with that.  She sometimes has had associated vomiting.  She also describes intermittent episodes of diffuse lower abdominal cramping.  No RUQ pain.  She is question of whether she has IBS in the past.  She has not had any constipation or diarrhea.  No recent change in bowel habits.  Appetite and weight stable.  She had colonoscopy 2010 which was unremarkable.  She plans to get follow-up this year.  She has history of osteoarthritis left knee and is taking Duexis (Motrin + Famotidine) per orthopedist daily.  Denies any epigastric pain.  Pain is more lower abdominal region.  No family history of celiac disease.  Past Medical History:  Diagnosis Date  . History of shingles   . LGSIL (low grade squamous intraepithelial dysplasia) 2009  . Osteopenia 12/2013   T score -1.2 FRAX 5.7%/0.3%   Past Surgical History:  Procedure Laterality Date  . AUGMENTATION MAMMAPLASTY Bilateral   . BLADDER SUSPENSION    . COLONOSCOPY  04/29/09   [place]    reports that she has quit smoking. She has never used smokeless tobacco. She reports that she drinks about 1.0 standard drinks of alcohol per week. She reports that she does not use drugs. family history includes Arthritis in her father; Leukemia in her mother. Allergies  Allergen Reactions  . Penicillins      Review of Systems  Constitutional: Negative for appetite change, chills and fever.  Respiratory:  Negative for shortness of breath.   Cardiovascular: Negative for chest pain.  Gastrointestinal: Positive for abdominal pain. Negative for blood in stool, constipation, diarrhea and rectal pain.  Genitourinary: Negative for dysuria.       Objective:   Physical Exam  Constitutional: She appears well-developed and well-nourished.  Cardiovascular: Normal rate and regular rhythm.  Pulmonary/Chest: Effort normal and breath sounds normal.  Abdominal: Soft. Bowel sounds are normal. She exhibits no mass. There is no tenderness. There is no rebound and no guarding.       Assessment:     Presents with several month if not year history of intermittent bloating and intermittent diffuse abdominal pain No history of lactose intolerance.  No history of any stool changes to suggest likely IBS.  ?SIBO syndrome- but no loose stools.      Plan:     -Consider keeping a food diary to see if other foods correlate with her symptoms -Avoidance of known aggravating foods -Check tissue transglutaminase antibody -Consider probiotic such as Align -She will need repeat colonoscopy next year.  If she is not seeing improvement with probiotics consider GI referral -Recommend avoid nonsteroidals as much as possible  Eulas Post MD [facility]

## 2018-08-11 NOTE — Patient Instructions (Signed)
Consider probiotic daily such as Align  We need to get repeat colonoscopy by next year.

## 2018-09-12 ENCOUNTER — Ambulatory Visit: Payer: Self-pay

## 2018-09-12 NOTE — Telephone Encounter (Signed)
Patient called in with c/o "abdominal pain." She says "I am doubled over in pain to my lower abdomen near my pelvic area. I feel pressure, huge pressure. This started last night, but not as severe as it is now. I just left [facility] because the parking lot was so full and I'm pulling up at [facility] on [address]." I asked what makes the pain worse or better, she says "I drank water and it made the pain worse after about an hour. I urinated and it was some better, because it felt like pressure was relieved." I asked about other symptoms, she denies. According to protocol, go to ED. I advised if she doesn't go into the UC to go to the ED, she verbalized understanding.  Reason for Disposition . [1] SEVERE pain (e.g., excruciating) AND [2] present > 1 hour  Answer Assessment - Initial Assessment Questions 1. LOCATION: "Where does it hurt?"      Lower abdomen 2. RADIATION: "Does the pain shoot anywhere else?" (e.g., chest, back)     Just there 3. ONSET: "When did the pain begin?" (e.g., minutes, hours or days ago)      Last night, not this severe 4. SUDDEN: "Gradual or sudden onset?"     Gradual when it started, now just huge pressure 5. PATTERN "Does the pain come and go, or is it constant?"    - If constant: "Is it getting better, staying the same, or worsening?"      (Note: Constant means the pain never goes away completely; most serious pain is constant and it progresses)     - If intermittent: "How long does it last?" "Do you have pain now?"     (Note: Intermittent means the pain goes away completely between bouts)     Constant 6. SEVERITY: "How bad is the pain?"  (e.g., Scale 1-10; mild, moderate, or severe)   - MILD (1-3): doesn't interfere with normal activities, abdomen soft and not tender to touch    - MODERATE (4-7): interferes with normal activities or awakens from sleep, tender to touch    - SEVERE (8-10): excruciating pain, doubled over, unable to do any normal  activities      Severe 7. RECURRENT SYMPTOM: "Have you ever had this type of abdominal pain before?" If so, ask: "When was the last time?" and "What happened that time?"      Never 8. CAUSE: "What do you think is causing the abdominal pain?"     I don't know 9. RELIEVING/AGGRAVATING FACTORS: "What makes it better or worse?" (e.g., movement, antacids, bowel movement)     Sip water made it worse, urinating gave a little relief 10. OTHER SYMPTOMS: "Has there been any vomiting, diarrhea, constipation, or urine problems?"       No 11. PREGNANCY: "Is there any chance you are pregnant?" "When was your last menstrual period?"       No  Protocols used: ABDOMINAL PAIN - [facility]

## 2018-09-13 NOTE — Telephone Encounter (Signed)
Agree with advice as given.

## 2018-09-13 NOTE — Telephone Encounter (Signed)
Noted  

## 2018-12-25 ENCOUNTER — Other Ambulatory Visit: Payer: Self-pay | Admitting: Gynecology

## 2018-12-25 DIAGNOSIS — Z1231 Encounter for screening mammogram for malignant neoplasm of breast: Secondary | ICD-10-CM

## 2019-01-22 ENCOUNTER — Ambulatory Visit: Payer: 59

## 2019-01-24 ENCOUNTER — Other Ambulatory Visit: Payer: Self-pay

## 2019-01-25 ENCOUNTER — Encounter: Payer: Self-pay | Admitting: Gynecology

## 2019-01-25 ENCOUNTER — Ambulatory Visit (INDEPENDENT_AMBULATORY_CARE_PROVIDER_SITE_OTHER): Payer: 59 | Admitting: Gynecology

## 2019-01-25 VITALS — BP 118/76 | Ht 62.0 in | Wt 146.0 lb

## 2019-01-25 DIAGNOSIS — M8589 Other specified disorders of bone density and structure, multiple sites: Secondary | ICD-10-CM

## 2019-01-25 DIAGNOSIS — Z01419 Encounter for gynecological examination (general) (routine) without abnormal findings: Secondary | ICD-10-CM

## 2019-01-25 DIAGNOSIS — N952 Postmenopausal atrophic vaginitis: Secondary | ICD-10-CM

## 2019-01-25 NOTE — Patient Instructions (Signed)
Schedule your bone density whenever you are able.  Schedule your mammography when you are able.  Schedule your colonoscopy when you are able  Follow-up in 1 year for annual exam

## 2019-01-25 NOTE — Progress Notes (Signed)
    Jackie Hubbard 04-14-1961 [id number]        58 y.o.  G3P3 for annual gynecologic exam.  Doing well without gynecologic complaints  Past medical history,surgical history, problem list, medications, allergies, family history and social history were all reviewed and documented as reviewed in the EPIC chart.  ROS:  Performed with pertinent positives and negatives included in the history, assessment and plan.   Additional significant findings : None   Exam: [name] assistant Vitals:   01/25/19 1130  BP: 118/76  Weight: 146 lb (66.2 kg)  Height: 5\' 2"  (1.575 m)   Body mass index is 26.7 kg/m.  General appearance:  Normal affect, orientation and appearance. Skin: Grossly normal HEENT: Without gross lesions.  No cervical or supraclavicular adenopathy. Thyroid normal.  Lungs:  Clear without wheezing, rales or rhonchi Cardiac: RR, without RMG Abdominal:  Soft, nontender, without masses, guarding, rebound, organomegaly or hernia Breasts:  Examined lying and sitting without masses, retractions, discharge or axillary adenopathy.  Bilateral implants noted. Pelvic:  Ext, BUS, Vagina: With atrophic changes  Cervix: With atrophic changes  Uterus: Anteverted, normal size, shape and contour, midline and mobile nontender   Adnexa: Without masses or tenderness    Anus and perineum: Normal   Rectovaginal: Normal sphincter tone without palpated masses or tenderness.    Assessment/Plan:  58 y.o. G3P3 female for annual gynecologic exam.   1. Postmenopausal.  No significant menopausal symptoms or any vaginal bleeding. 2. Pap smear 2017.  Pap smear done today.  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 3. Colonoscopy due now and was rescheduled due to [place].  She will follow-up for this when it is rescheduled. 4. Mammography due now and it has been rescheduled due to [place].  Patient will follow-up for this when it is scheduled.  Breast exam normal today.  SBE monthly  reviewed. 5. Osteopenia.  DEXA 2015 T score -1.2 FRAX 5.7% / 0.3%.  Recommend follow-up DEXA now after COVID restrictions lifted.  Patient will schedule in follow-up for this during the summer.  Continues to be active with regular exercise. 6. Health maintenance.  No routine lab work done as patient does this elsewhere.  Follow-up 1 year, sooner as needed.   Anastasio Auerbach MD, 12:01 PM 01/25/2019

## 2019-01-25 NOTE — Addendum Note (Signed)
Addended by: Nelva Nay on: 01/25/2019 12:21 PM   Modules accepted: Orders

## 2019-01-26 LAB — PAP IG W/ RFLX HPV ASCU

## 2019-03-13 ENCOUNTER — Other Ambulatory Visit: Payer: Self-pay

## 2019-03-13 ENCOUNTER — Ambulatory Visit
Admission: RE | Admit: 2019-03-13 | Discharge: 2019-03-13 | Disposition: A | Discharge status: Home / Self Care | Payer: 59 | Source: Ambulatory Visit | Attending: Gynecology | Admitting: Gynecology

## 2019-03-13 DIAGNOSIS — Z1231 Encounter for screening mammogram for malignant neoplasm of breast: Secondary | ICD-10-CM

## 2019-04-11 ENCOUNTER — Encounter: Payer: Self-pay | Admitting: Internal Medicine

## 2019-07-04 ENCOUNTER — Encounter: Payer: Self-pay | Admitting: Family Medicine

## 2019-07-04 ENCOUNTER — Ambulatory Visit (INDEPENDENT_AMBULATORY_CARE_PROVIDER_SITE_OTHER): Payer: 59 | Admitting: Family Medicine

## 2019-07-04 ENCOUNTER — Other Ambulatory Visit: Payer: Self-pay

## 2019-07-04 VITALS — BP 110/70 | HR 53 | Temp 98.0°F | Wt 132.8 lb

## 2019-07-04 DIAGNOSIS — R112 Nausea with vomiting, unspecified: Secondary | ICD-10-CM | POA: Diagnosis not present

## 2019-07-04 DIAGNOSIS — R1013 Epigastric pain: Secondary | ICD-10-CM | POA: Diagnosis not present

## 2019-07-04 NOTE — Patient Instructions (Signed)
Vomiting, Adult Vomiting occurs when stomach contents are thrown up and out of the mouth. Many people notice nausea before vomiting. Vomiting can make you feel weak and cause you to become dehydrated. Dehydration can make you feel tired and thirsty, cause you to have a dry mouth, and decrease how often you urinate. Older adults and people who have other diseases or a weak body defense system (immune system) are at higher risk for dehydration. It is important to treat vomiting as told by your health care provider. Follow these instructions at home:  Eating and drinking     Follow these recommendations as told by your health care provider:  Take an oral rehydration solution (ORS). This is a drink that is sold at pharmacies and retail stores.  Eat bland, easy-to-digest foods in small amounts as you are able. These foods include bananas, applesauce, rice, lean meats, toast, and crackers.  Drink clear fluids slowly and in small amounts as you are able. Clear fluids include water, ice chips, low-calorie sports drinks, and fruit juice that has water added (diluted fruit juice).  Avoid drinking fluids that contain a lot of sugar or caffeine, such as energy drinks, sports drinks, and soda.  Avoid alcohol.  Avoid spicy or fatty foods.  General instructions  Wash your hands often using soap and water. If soap and water are not available, use hand sanitizer. Make sure that everyone in your household washes their hands frequently.  Take over-the-counter and prescription medicines only as told by your health care provider.  Rest at home while you recover.  Watch your condition for any changes.  Keep all follow-up visits as told by your health care provider. This is important. Contact a health care provider if:  Your vomiting gets worse.  You have new symptoms.  You have a fever.  You cannot drink fluids without vomiting.  You feel light-headed or dizzy.  You have a headache.  You  have muscle cramps.  You have a rash.  You have pain while urinating. Get help right away if:  You have pain in your chest, neck, arm, or jaw.  You feel extremely weak or you faint.  You have persistent vomiting.  You have vomit that is bright red or looks like black coffee grounds.  You have stools that are bloody or black, or stools that look like tar.  You have a severe headache, a stiff neck, or both.  You have severe pain, cramping, or bloating in your abdomen.  You have trouble breathing or you are breathing very quickly.  Your heart is beating very quickly.  Your skin feels cold and clammy.  You feel confused.  You have signs of dehydration, such as: ? Dark urine, very little urine, or no urine. ? Cracked lips. ? Dry mouth. ? Sunken eyes. ? Sleepiness. ? Weakness. These symptoms may represent a serious problem that is an emergency. Do not wait to see if the symptoms will go away. Get medical help right away. Call your local emergency services (911 in the [place] Do not drive yourself to the hospital. Summary  Vomiting occurs when stomach contents are thrown up and out of the mouth. Vomiting can cause you to become dehydrated. Older adults and people who have other diseases or a weak immune system are at higher risk for dehydration.  It is important to treat vomiting as told by your health care provider. Follow your health care provider's instructions about eating and drinking.  Wash your hands often using   soap and water. If soap and water are not available, use hand sanitizer. Make sure that everyone in your household washes their hands frequently.  Watch your condition for any changes and for signs of dehydration.  Keep all follow-up visits as told by your health care provider. This is important. This information is not intended to replace advice given to you by your health care provider. Make sure you discuss any questions you have with your health care  provider. Document Released: 10/24/2015 Document Revised: 03/07/2018 Document Reviewed: 03/07/2018 Elsevier Patient Education  [name].  We will set up UGI to assess upper GI tract.

## 2019-07-04 NOTE — Progress Notes (Signed)
Subjective:     Patient ID: Jackie Hubbard, female   DOB: August 24, 1961, 58 y.o.   MRN: [id number]  HPI   Patient is seen with episodic somewhat fleeting and fairly acute episodes of upper abdominal pain epigastric region associated with nausea and vomiting and bloating and then prompt relief after vomiting.  She sometimes notices this to be postprandial but sometimes can be within 30 minutes of eating and sometimes couple hours.  Symptoms come on fairly suddenly of nausea followed by some epigastric pain and then nonbloody emesis.  This seems to relieve her symptoms.  She recalls episode earlier this week when she had some peanut butter, banana, and coffee and then symptoms occurred.  She has never had right upper quadrant pain.  No radiation of pain.  No melena.  She has lost some weight over the past year but she states this is due to her effort.  She has good appetite.  No hx of PUD or pancreatitis.   No history of gallstones.  No associated chest pains.  No exercise intolerance.  No known food allergies.  No stool changes.  Denies any classic reflux symptoms  Past Medical History:  Diagnosis Date  . History of shingles   . LGSIL (low grade squamous intraepithelial dysplasia) 2009  . Osteopenia 12/2013   T score -1.2 FRAX 5.7%/0.3%   Past Surgical History:  Procedure Laterality Date  . AUGMENTATION MAMMAPLASTY Bilateral   . BLADDER SUSPENSION    . COLONOSCOPY  04/29/09   [place]    reports that she has quit smoking. She has never used smokeless tobacco. She reports current alcohol use of about 1.0 standard drinks of alcohol per week. She reports that she does not use drugs. family history includes Arthritis in her father; Leukemia in her mother. Allergies  Allergen Reactions  . Penicillins Other (See Comments)    Reaction as a child     Review of Systems  Constitutional: Negative for appetite change, chills, fever and unexpected weight change.  Respiratory: Negative for cough  and shortness of breath.   Cardiovascular: Negative for chest pain and leg swelling.  Gastrointestinal: Positive for abdominal pain, nausea and vomiting. Negative for blood in stool.  Genitourinary: Negative for dysuria.  Neurological: Negative for dizziness.       Objective:   Physical Exam Constitutional:      General: She is not in acute distress.    Appearance: She is well-developed. She is not ill-appearing or toxic-appearing.  Cardiovascular:     Rate and Rhythm: Normal rate and regular rhythm.  Pulmonary:     Effort: Pulmonary effort is normal.     Breath sounds: Normal breath sounds.  Abdominal:     General: Abdomen is flat.     Palpations: Abdomen is soft. There is no hepatomegaly, splenomegaly or mass.     Tenderness: There is no abdominal tenderness.     Hernia: There is no hernia in the umbilical area.     Comments: No reproducible abdominal tenderness.  No hepatomegaly.  No splenomegaly.  Neurological:     Mental Status: She is alert.        Assessment:     Patient describes several week history of intermittent epigastric pain associated with acute nausea and vomiting with symptoms relieved by vomiting.  She does not describe any right upper quadrant pain and symptoms do not sound typical for gallstones.  Doubt gastroparesis.  She does not describe any worrisome symptoms for active ulcer bleed.  No cardiac  symptoms.  No known history of food allergies or food intolerance    Plan:     -Recommend further evaluation with serum lipase, CBC, comprehensive metabolic panel -Set up upper GI x-rays to further evaluate -We have also highly suggested she keep a diary of episodes and try to call to see if there is any correlation with specific foods -Consider GI referral if the above unrevealing  Eulas Post MD [facility] at [facility]

## 2019-07-05 LAB — COMPREHENSIVE METABOLIC PANEL
ALT: 26 U/L (ref 0–35)
AST: 25 U/L (ref 0–37)
Albumin: 4.2 g/dL (ref 3.5–5.2)
Alkaline Phosphatase: 87 U/L (ref 39–117)
BUN: 14 mg/dL (ref 6–23)
CO2: 28 mEq/L (ref 19–32)
Calcium: 8.9 mg/dL (ref 8.4–10.5)
Chloride: 103 mEq/L (ref 96–112)
Creatinine, Ser: 0.75 mg/dL (ref 0.40–1.20)
GFR: 78.69 mL/min (ref >60.00)
Glucose, Bld: 72 mg/dL (ref 70–99)
Potassium: 3.8 mEq/L (ref 3.5–5.1)
Sodium: 138 mEq/L (ref 135–145)
Total Bilirubin: 0.3 mg/dL (ref 0.2–1.2)
Total Protein: 6.5 g/dL (ref 6.0–8.3)

## 2019-07-05 LAB — CBC WITH DIFFERENTIAL/PLATELET
Basophils Absolute: 0 10*3/uL (ref 0.0–0.1)
Basophils Relative: 0.4 % (ref 0.0–3.0)
Eosinophils Absolute: 0.1 10*3/uL (ref 0.0–0.7)
Eosinophils Relative: 1.8 % (ref 0.0–5.0)
HCT: 37.9 % (ref 36.0–46.0)
Hemoglobin: 12.8 g/dL (ref 12.0–15.0)
Lymphocytes Relative: 44.1 % (ref 12.0–46.0)
Lymphs Abs: 2.8 10*3/uL (ref 0.7–4.0)
MCHC: 33.8 g/dL (ref 30.0–36.0)
MCV: 92.4 fl (ref 78.0–100.0)
Monocytes Absolute: 0.6 10*3/uL (ref 0.1–1.0)
Monocytes Relative: 9.3 % (ref 3.0–12.0)
Neutro Abs: 2.8 10*3/uL (ref 1.4–7.7)
Neutrophils Relative %: 44.4 % (ref 43.0–77.0)
Platelets: 190 10*3/uL (ref 150.0–400.0)
RBC: 4.1 Mil/uL (ref 3.87–5.11)
RDW: 12.5 % (ref 11.5–15.5)
WBC: 6.4 10*3/uL (ref 4.0–10.5)

## 2019-07-05 LAB — LIPASE: Lipase: 19 U/L (ref 11.0–59.0)

## 2019-07-10 ENCOUNTER — Encounter: Payer: Self-pay | Admitting: Gynecology

## 2019-12-10 ENCOUNTER — Ambulatory Visit (INDEPENDENT_AMBULATORY_CARE_PROVIDER_SITE_OTHER): Payer: 59 | Admitting: *Deleted

## 2019-12-10 ENCOUNTER — Other Ambulatory Visit: Payer: Self-pay

## 2019-12-10 DIAGNOSIS — Z111 Encounter for screening for respiratory tuberculosis: Secondary | ICD-10-CM

## 2019-12-10 NOTE — Progress Notes (Signed)
Per orders of Dr. Elease Hashimoto, injection of PPD placement  given by Zacarias Pontes. Patient tolerated injection well.

## 2019-12-12 LAB — TB SKIN TEST
Induration: 0 mm
TB Skin Test: NEGATIVE

## 2019-12-20 ENCOUNTER — Other Ambulatory Visit: Payer: Self-pay

## 2019-12-21 ENCOUNTER — Ambulatory Visit: Payer: 59 | Attending: Internal Medicine

## 2019-12-21 ENCOUNTER — Ambulatory Visit (INDEPENDENT_AMBULATORY_CARE_PROVIDER_SITE_OTHER): Payer: 59 | Admitting: Family Medicine

## 2019-12-21 ENCOUNTER — Other Ambulatory Visit: Payer: Self-pay

## 2019-12-21 ENCOUNTER — Encounter: Payer: Self-pay | Admitting: Family Medicine

## 2019-12-21 VITALS — BP 110/72 | HR 61 | Temp 97.9°F | Ht 62.0 in | Wt 135.8 lb

## 2019-12-21 DIAGNOSIS — R42 Dizziness and giddiness: Secondary | ICD-10-CM | POA: Diagnosis not present

## 2019-12-21 DIAGNOSIS — Z23 Encounter for immunization: Secondary | ICD-10-CM

## 2019-12-21 NOTE — Patient Instructions (Signed)

## 2019-12-21 NOTE — Progress Notes (Signed)
   Covid-19 Vaccination Clinic  Name:  Jackie Hubbard    MRN: [id number] DOB: 09-16-1961  12/21/2019  Jackie Hubbard was observed post Covid-19 immunization for 15 minutes without incident. She was provided with Vaccine Information Sheet and instruction to access the V-Safe system.   Jackie Hubbard was instructed to call 911 with any severe reactions post vaccine: Marland Kitchen Difficulty breathing  . Swelling of face and throat  . A fast heartbeat  . A bad rash all over body  . Dizziness and weakness   Immunizations Administered    Name Date Dose VIS Date Route   Pfizer COVID-19 Vaccine 12/21/2019 10:44 AM 0.3 mL 09/21/2019 Intramuscular   Manufacturer: [place]   Lot: [id number]   [place]: [id number]

## 2019-12-21 NOTE — Progress Notes (Signed)
Subjective:     Patient ID: Jackie Hubbard, female   DOB: 1960/10/19, 59 y.o.   MRN: [id number]  HPI Khamila seen with a couple of very transient episodes of with the like vertigo.  She had one episode recently she was driving and had about 5 seconds of vertigo type symptoms.  She had another episode about a month ago.  This was not to her recollection related to blowing her nose or Valsalva type maneuver.  She has not had any significant nasal congestion.  She denies any hearing changes.  No tinnitus.  No headaches.  No coordination issues.  No focal weakness.  No syncope.  No lightheadedness.  No clear triggering factors.  Symptoms were very transient lasting only a few seconds.  No associated chest pains.  Denies any otalgia.  No allergy symptoms.  She had recent shoulder difficulties and is getting rotator cuff repair this coming Tuesday.  Past Medical History:  Diagnosis Date  . History of shingles   . LGSIL (low grade squamous intraepithelial dysplasia) 2009  . Osteopenia 12/2013   T score -1.2 FRAX 5.7%/0.3%   Past Surgical History:  Procedure Laterality Date  . AUGMENTATION MAMMAPLASTY Bilateral   . BLADDER SUSPENSION    . COLONOSCOPY  04/29/09   [place]    reports that she has quit smoking. She has never used smokeless tobacco. She reports current alcohol use of about 1.0 standard drinks of alcohol per week. She reports that she does not use drugs. family history includes Arthritis in her father; Leukemia in her mother. Allergies  Allergen Reactions  . Penicillins Other (See Comments)    Reaction as a child     Review of Systems  Constitutional: Negative for appetite change, chills, fatigue, fever and unexpected weight change.  Eyes: Negative for visual disturbance.  Respiratory: Negative for cough, chest tightness, shortness of breath and wheezing.   Cardiovascular: Negative for chest pain, palpitations and leg swelling.  Genitourinary: Negative for dysuria.   Neurological: Positive for dizziness. Negative for seizures, syncope, weakness, light-headedness and headaches.  Psychiatric/Behavioral: Negative for confusion.       Objective:   Physical Exam Vitals reviewed.  Constitutional:      Appearance: Normal appearance.  Eyes:     Pupils: Pupils are equal, round, and reactive to light.  Neck:     Comments: No carotid bruits Cardiovascular:     Rate and Rhythm: Normal rate and regular rhythm.     Heart sounds: No murmur.  Pulmonary:     Effort: Pulmonary effort is normal.     Breath sounds: Normal breath sounds.  Musculoskeletal:     Cervical back: Neck supple.  Neurological:     General: No focal deficit present.     Mental Status: She is alert and oriented to person, place, and time.     Cranial Nerves: No cranial nerve deficit.     Motor: No weakness.     Coordination: Coordination normal.     Gait: Gait normal.        Assessment:     Episodes of transient vertigo.  These have lasted only about 4 or 5 seconds.  She had a couple episodes of the past month.  These are not clearly triggered by positional change.  She does not have any red flags such as speech change, focal weakness, hearing change, swallowing difficulties, ataxia, etc.    Plan:     -Recommend observation for now.  Be in touch if she is having any  the red flags above or if her symptoms are becoming more persistent in terms of duration or frequency -Handout given on vertigo things to watch for  Eulas Post MD [facility] at [facility]

## 2020-01-14 ENCOUNTER — Ambulatory Visit: Payer: 59 | Attending: Internal Medicine

## 2020-01-14 DIAGNOSIS — Z23 Encounter for immunization: Secondary | ICD-10-CM

## 2020-01-14 NOTE — Progress Notes (Signed)
   Covid-19 Vaccination Clinic  Name:  Jackie Hubbard    MRN: [id number] DOB: 07/30/1961  01/14/2020  [name] was observed post Covid-19 immunization for 15 minutes without incident. She was provided with Vaccine Information Sheet and instruction to access the V-Safe system.   [name] was instructed to call 911 with any severe reactions post vaccine: Marland Kitchen Difficulty breathing  . Swelling of face and throat  . A fast heartbeat  . A bad rash all over body  . Dizziness and weakness   Immunizations Administered    Name Date Dose VIS Date Route   Pfizer COVID-19 Vaccine 01/14/2020  2:15 PM 0.3 mL 09/21/2019 Intramuscular   Manufacturer: [place]   Lot: [id number]   [place]: [id number]

## 2020-01-25 ENCOUNTER — Other Ambulatory Visit: Payer: Self-pay

## 2020-01-28 ENCOUNTER — Ambulatory Visit (INDEPENDENT_AMBULATORY_CARE_PROVIDER_SITE_OTHER): Payer: 59 | Admitting: Obstetrics and Gynecology

## 2020-01-28 ENCOUNTER — Other Ambulatory Visit: Payer: Self-pay

## 2020-01-28 ENCOUNTER — Encounter: Payer: Self-pay | Admitting: Obstetrics and Gynecology

## 2020-01-28 VITALS — BP 116/76 | Ht 62.0 in | Wt 137.0 lb

## 2020-01-28 DIAGNOSIS — M8589 Other specified disorders of bone density and structure, multiple sites: Secondary | ICD-10-CM

## 2020-01-28 DIAGNOSIS — Z01419 Encounter for gynecological examination (general) (routine) without abnormal findings: Secondary | ICD-10-CM

## 2020-01-28 NOTE — Progress Notes (Signed)
   Jackie Hubbard 25-Apr-1961 [id number]  SUBJECTIVE:  59 y.o. G3P3 female for annual routine gynecologic exam. She has no gynecologic concerns. Wearing a right arm sling for rotator cuff injury.  Current Outpatient Medications  Medication Sig Dispense Refill  . Ibuprofen-Famotidine (DUEXIS) 800-26.6 MG TABS Take 1 tablet by mouth daily.    . methocarbamol (ROBAXIN) 500 MG tablet Take 1 tablet (500 mg total) 4 (four) times daily as needed by mouth for muscle spasms. 30 tablet 0   No current facility-administered medications for this visit.   Allergies: Penicillins  No LMP recorded. Patient is postmenopausal.  Past medical history,surgical history, problem list, medications, allergies, family history and social history were all reviewed and documented as reviewed in the EPIC chart.  ROS:  Feeling well. No dyspnea or chest pain on exertion.  No abdominal pain, change in bowel habits, black or bloody stools.  No urinary tract symptoms. GYN ROS: no abnormal bleeding, pelvic pain or discharge, no breast pain or new or enlarging lumps on self exam. No neurological complaints.   OBJECTIVE:  Ht 5\' 2"  (1.575 m)   Wt 137 lb (62.1 kg)   BMI 25.06 kg/m  The patient appears well, alert, oriented x 3, in no distress. ENT normal.  Neck supple. No cervical or supraclavicular adenopathy or thyromegaly.  Lungs are clear, good air entry, no wheezes, rhonchi or rales. S1 and S2 normal, no murmurs, regular rate and rhythm.  Abdomen soft without tenderness, guarding, mass or organomegaly.  Neurological is normal, no focal findings.  BREAST EXAM: breasts appear normal, no suspicious masses, no skin or nipple changes or axillary nodes (right breast examined around the arm sling, unable to fully access right axilla for lymphadenopathy)  PELVIC EXAM: VULVA: normal appearing vulva with no masses, tenderness or lesions, VAGINA: normal appearing vagina with normal color and discharge, no lesions, CERVIX:  normal appearing cervix without discharge or lesions, UTERUS: uterus is normal size, shape, consistency and nontender, ADNEXA: normal adnexa in size, nontender and no masses  Chaperone: [name] present during the examination  ASSESSMENT:  59 y.o. G3P3 here for annual gynecologic exam  PLAN:   1. Postmenopausal.  No menopausal symptoms. 2. Pap smear 01/2019.  History LGSIL Pap in 2009, then normal Pap smears after that.  Next Pap smear due 2023 following the current guidelines recommending the 3 year interval. 3. Mammogram 03/2019.  Normal breast exam today. Continue annual mammogram this year when due. 4. Colonoscopy 2010, got rescheduled due to pandemic.  Recommended that she follow up and ensure that she gets this done. 5. Osteopenia.  DEXA 2015 T score -1.2 FRAX 5.7% / 0.3%.  Next DEXA recommended now so she plans to schedule this when she has her other medical concerns addressed. 6. Health maintenance.  No labs today as she normally has these completed with her primary care provider.   Return annually or sooner, prn.  Joseph Pierini MD 01/28/20

## 2020-05-16 ENCOUNTER — Other Ambulatory Visit: Payer: Self-pay | Admitting: Obstetrics and Gynecology

## 2020-05-16 DIAGNOSIS — Z1231 Encounter for screening mammogram for malignant neoplasm of breast: Secondary | ICD-10-CM

## 2020-05-27 ENCOUNTER — Other Ambulatory Visit: Payer: Self-pay

## 2020-05-27 ENCOUNTER — Ambulatory Visit
Admission: RE | Admit: 2020-05-27 | Discharge: 2020-05-27 | Disposition: A | Discharge status: Home / Self Care | Payer: 59 | Source: Ambulatory Visit | Attending: Obstetrics and Gynecology | Admitting: Obstetrics and Gynecology

## 2020-05-27 DIAGNOSIS — Z1231 Encounter for screening mammogram for malignant neoplasm of breast: Secondary | ICD-10-CM

## 2020-07-09 ENCOUNTER — Telehealth: Payer: Self-pay | Admitting: Family Medicine

## 2020-07-09 DIAGNOSIS — K429 Umbilical hernia without obstruction or gangrene: Secondary | ICD-10-CM

## 2020-07-09 NOTE — Telephone Encounter (Signed)
Okay to refer? 

## 2020-07-09 NOTE — Telephone Encounter (Signed)
pt requesting a referral to [facility] for her hernia

## 2020-07-10 ENCOUNTER — Ambulatory Visit (INDEPENDENT_AMBULATORY_CARE_PROVIDER_SITE_OTHER): Payer: 59 | Admitting: Family Medicine

## 2020-07-10 ENCOUNTER — Other Ambulatory Visit: Payer: Self-pay

## 2020-07-10 ENCOUNTER — Encounter: Payer: Self-pay | Admitting: Family Medicine

## 2020-07-10 VITALS — BP 100/60 | HR 57 | Temp 98.3°F | Ht 64.0 in | Wt 131.8 lb

## 2020-07-10 DIAGNOSIS — R1013 Epigastric pain: Secondary | ICD-10-CM

## 2020-07-10 MED ORDER — OMEPRAZOLE 40 MG PO CPDR: 40.0000 mg | DELAYED_RELEASE_CAPSULE | Freq: Every day | ORAL | 0 refills | Status: DC

## 2020-07-10 NOTE — Telephone Encounter (Signed)
OK 

## 2020-07-10 NOTE — Progress Notes (Signed)
   Subjective:    Patient ID: Jackie Hubbard, female    DOB: 02/06/1961, 59 y.o.   MRN: [id number]  HPI Here for worsening upper abdominal pain. She has had intermittent pains for several months, but in th elast week the pains have become continuous. They are annoying but not severe. No fever. No nausea or vomiting. Eating food has no effect on the pain, although her appetite has been decreased. BMs are regular. Her last colonoscopy in 2010 was unremarkable. She has no heartburn or trouble swallowing. She is very alarmed by this pain.    Review of Systems  Constitutional: Negative.   Respiratory: Negative.   Cardiovascular: Negative.   Gastrointestinal: Positive for abdominal pain. Negative for abdominal distention, anal bleeding, blood in stool, constipation, diarrhea, nausea, rectal pain and vomiting.  Genitourinary: Negative.        Objective:   Physical Exam Constitutional:      Appearance: Normal appearance. She is well-developed. She is not ill-appearing.  Cardiovascular:     Rate and Rhythm: Normal rate and regular rhythm.     Pulses: Normal pulses.     Heart sounds: Normal heart sounds.  Pulmonary:     Effort: Pulmonary effort is normal.     Breath sounds: Normal breath sounds.  Abdominal:     General: Abdomen is flat. Bowel sounds are normal. There is no distension.     Palpations: Abdomen is soft. There is no mass.     Tenderness: There is no guarding or rebound.     Hernia: No hernia is present.     Comments: Mildly tender in the epigastrium   Neurological:     Mental Status: She is alert.           Assessment & Plan:  Epigastric pain, likely due to gastritis or PUD. She will start taking Omeprazole 40 mg daily. Get labs today. We will set up a CT of the abdomen and pelvis soon.  Alysia Penna, MD

## 2020-07-11 ENCOUNTER — Other Ambulatory Visit: Payer: Self-pay

## 2020-07-11 ENCOUNTER — Encounter: Payer: Self-pay | Admitting: Family Medicine

## 2020-07-11 ENCOUNTER — Ambulatory Visit
Admission: RE | Admit: 2020-07-11 | Discharge: 2020-07-11 | Disposition: A | Discharge status: Home / Self Care | Payer: 59 | Source: Ambulatory Visit | Attending: Family Medicine | Admitting: Family Medicine

## 2020-07-11 DIAGNOSIS — R1013 Epigastric pain: Secondary | ICD-10-CM

## 2020-07-11 LAB — CBC WITH DIFFERENTIAL/PLATELET
Absolute Monocytes: 433 cells/uL (ref 200–950)
Basophils Absolute: 92 cells/uL (ref 0–200)
Basophils Relative: 1.3 %
Eosinophils Absolute: 142 cells/uL (ref 15–500)
Eosinophils Relative: 2 %
HCT: 42.2 % (ref 35.0–45.0)
Hemoglobin: 14 g/dL (ref 11.7–15.5)
Lymphs Abs: 2215 cells/uL (ref 850–3900)
MCH: 30.4 pg (ref 27.0–33.0)
MCHC: 33.2 g/dL (ref 32.0–36.0)
MCV: 91.7 fL (ref 80.0–100.0)
MPV: 11.4 fL (ref 7.5–12.5)
Monocytes Relative: 6.1 %
Neutro Abs: 4217 cells/uL (ref 1500–7800)
Neutrophils Relative %: 59.4 %
Platelets: 287 10*3/uL (ref 140–400)
RBC: 4.6 10*6/uL (ref 3.80–5.10)
RDW: 12.2 % (ref 11.0–15.0)
Total Lymphocyte: 31.2 %
WBC: 7.1 10*3/uL (ref 3.8–10.8)

## 2020-07-11 LAB — BASIC METABOLIC PANEL
BUN: 14 mg/dL (ref 7–25)
CO2: 26 mmol/L (ref 20–32)
Calcium: 9.6 mg/dL (ref 8.6–10.4)
Chloride: 102 mmol/L (ref 98–110)
Creat: 0.76 mg/dL (ref 0.50–0.99)
Glucose, Bld: 148 mg/dL — ABNORMAL HIGH (ref 65–99)
Potassium: 3.7 mmol/L (ref 3.5–5.3)
Sodium: 138 mmol/L (ref 135–146)

## 2020-07-11 LAB — HEPATIC FUNCTION PANEL
AG Ratio: 1.9 (calc) (ref 1.0–2.5)
ALT: 17 U/L (ref 6–29)
AST: 22 U/L (ref 10–35)
Albumin: 4.6 g/dL (ref 3.6–5.1)
Alkaline phosphatase (APISO): 78 U/L (ref 37–153)
Bilirubin, Direct: 0.1 mg/dL (ref 0.0–0.2)
Globulin: 2.4 g/dL (calc) (ref 1.9–3.7)
Indirect Bilirubin: 0.5 mg/dL (calc) (ref 0.2–1.2)
Total Bilirubin: 0.6 mg/dL (ref 0.2–1.2)
Total Protein: 7 g/dL (ref 6.1–8.1)

## 2020-07-11 LAB — AMYLASE: Amylase: 63 U/L (ref 21–101)

## 2020-07-11 LAB — LIPASE: Lipase: 15 U/L (ref 7–60)

## 2020-07-11 MED ORDER — IOPAMIDOL (ISOVUE-300) INJECTION 61%
100.0000 mL | Freq: Once | INTRAVENOUS | Status: AC | PRN
  Administered 2020-07-11: 100 mL via INTRAVENOUS

## 2020-07-11 NOTE — Telephone Encounter (Signed)
I have the referral pended. Can you tell me the hernia dx to use?

## 2020-07-13 NOTE — Telephone Encounter (Signed)
Umbilical hernia

## 2020-07-15 ENCOUNTER — Ambulatory Visit (INDEPENDENT_AMBULATORY_CARE_PROVIDER_SITE_OTHER): Payer: 59 | Admitting: Family Medicine

## 2020-07-15 ENCOUNTER — Encounter: Payer: Self-pay | Admitting: Family Medicine

## 2020-07-15 ENCOUNTER — Other Ambulatory Visit: Payer: Self-pay

## 2020-07-15 VITALS — BP 108/80 | HR 57 | Temp 98.4°F | Ht 64.0 in | Wt 134.0 lb

## 2020-07-15 DIAGNOSIS — R1012 Left upper quadrant pain: Secondary | ICD-10-CM | POA: Diagnosis not present

## 2020-07-15 DIAGNOSIS — R112 Nausea with vomiting, unspecified: Secondary | ICD-10-CM | POA: Diagnosis not present

## 2020-07-15 NOTE — Patient Instructions (Signed)
Follow up for any recurrent vomiting or abdominal pain

## 2020-07-15 NOTE — Progress Notes (Signed)
Established Patient Office Visit  Subjective:  Patient ID: Jackie Hubbard, female    DOB: 18-Apr-1961  Age: 59 y.o. MRN: [id number]  CC: No chief complaint on file.   HPI Jackie Hubbard presents for follow-up regarding recent left upper quadrant abdominal pain and vomiting.  She was up in [place] rowing and in the middle of her rowing developed some acute nausea and vomiting.  Afterwards she noticed some left upper quadrant pain.  There was no hematemesis.  No melena.  She was seen here and had multiple labs including lipase, amylase, hepatic panel, CBC these were all normal.  CT abdomen and pelvis showed small umbilical hernia nonstrangulated but no other acute abnormalities.  Patient feels that she has had some bulging intermittently left upper quadrant as well as inguinal area which she is able to reduce with pressure in the past.  She is not aware of any pain in the periumbilical region.  She states her appetite is improving and at this point her nausea and vomiting have resolved.  No recent consistent diarrhea symptoms.  She wonders if she may have had left upper quadrant costochondral strain from her rowing.  No recent fever.  She has not had any recent chest pain or exertional type pains  Past Medical History:  Diagnosis Date  . History of shingles   . LGSIL (low grade squamous intraepithelial dysplasia) 2009  . Osteopenia 12/2013   T score -1.2 FRAX 5.7%/0.3%    Past Surgical History:  Procedure Laterality Date  . AUGMENTATION MAMMAPLASTY Bilateral   . BLADDER SUSPENSION    . COLONOSCOPY  04/29/09   [place]  . ROTATOR CUFF REPAIR      Family History  Problem Relation Age of Onset  . Leukemia Mother   . Arthritis Father   . Breast cancer Neg Hx     Social History   Socioeconomic History  . Marital status: Married    Spouse name: Not on file  . Number of children: Not on file  . Years of education: Not on file  . Highest education level: Not on file    Occupational History  . Not on file  Tobacco Use  . Smoking status: Former Research scientist (life sciences)  . Smokeless tobacco: Never Used  Vaping Use  . Vaping Use: Never used  Substance and Sexual Activity  . Alcohol use: Not Currently  . Drug use: No  . Sexual activity: Yes    Birth control/protection: Post-menopausal    Comment: 1st intercourse 59 yo-More than 5 partners  Other Topics Concern  . Not on file  Social History Narrative  . Not on file   Social Determinants of Health   Financial Resource Strain:   . Difficulty of Paying Living Expenses: Not on file  Food Insecurity:   . Worried About Charity fundraiser in the Last Year: Not on file  . Ran Out of Food in the Last Year: Not on file  Transportation Needs:   . Lack of Transportation (Medical): Not on file  . Lack of Transportation (Non-Medical): Not on file  Physical Activity:   . Days of Exercise per Week: Not on file  . Minutes of Exercise per Session: Not on file  Stress:   . Feeling of Stress : Not on file  Social Connections:   . Frequency of Communication with Friends and Family: Not on file  . Frequency of Social Gatherings with Friends and Family: Not on file  . Attends Religious Services: Not on file  .  Active Member of Clubs or Organizations: Not on file  . Attends Archivist Meetings: Not on file  . Marital Status: Not on file  Intimate Partner Violence:   . Fear of Current or Ex-Partner: Not on file  . Emotionally Abused: Not on file  . Physically Abused: Not on file  . Sexually Abused: Not on file    Outpatient Medications Prior to Visit  Medication Sig Dispense Refill  . methocarbamol (ROBAXIN) 500 MG tablet Take 1 tablet (500 mg total) 4 (four) times daily as needed by mouth for muscle spasms. (Patient not taking: Reported on 01/28/2020) 30 tablet 0  . naproxen (NAPROSYN) 500 MG tablet naproxen 500 mg tablet  TAKE 1 TABLET BY MOUTH TWICE DAILY (Patient not taking: Reported on 07/15/2020)    .  omeprazole (PRILOSEC) 40 MG capsule Take 1 capsule (40 mg total) by mouth daily. (Patient not taking: Reported on 07/15/2020) 30 capsule 0  . Ibuprofen-Famotidine (DUEXIS) 800-26.6 MG TABS Take 1 tablet by mouth daily.     No facility-administered medications prior to visit.    Allergies  Allergen Reactions  . Penicillins Other (See Comments)    Reaction as a child    ROS Review of Systems  Constitutional: Negative for appetite change, chills and fever.  Respiratory: Negative for cough and shortness of breath.   Cardiovascular: Negative for chest pain.  Gastrointestinal: Negative for blood in stool.       See HPI  Genitourinary: Negative for dysuria.  Skin: Negative for rash.      Objective:    Physical Exam Vitals reviewed.  Constitutional:      Appearance: Normal appearance.  Cardiovascular:     Rate and Rhythm: Normal rate and regular rhythm.  Pulmonary:     Effort: Pulmonary effort is normal.     Breath sounds: Normal breath sounds.  Abdominal:     General: There is no distension.     Palpations: Abdomen is soft.     Tenderness: There is no guarding or rebound.     Comments: Very small palpated area of weakness in abdominal wall umbilical area probably related to her umbilical hernia but no bulging at this time and non-tender.  Neurological:     Mental Status: She is alert.     BP 108/80 (BP Location: Left Arm, Patient Position: Sitting, Cuff Size: Normal)   Pulse (!) 57   Temp 98.4 F (36.9 C) (Oral)   Ht 5\' 4"  (1.626 m)   Wt 134 lb (60.8 kg)   SpO2 95%   BMI 23.00 kg/m  Wt Readings from Last 3 Encounters:  07/15/20 134 lb (60.8 kg)  07/10/20 131 lb 12.8 oz (59.8 kg)  01/28/20 137 lb (62.1 kg)     Health Maintenance Due  Topic Date Due  . Hepatitis C Screening  Never done  . HIV Screening  Never done  . TETANUS/TDAP  04/08/2019  . COLONOSCOPY  04/30/2019    There are no preventive care reminders to display for this patient.  Lab Results    Component Value Date   TSH 1.45 10/29/2015   Lab Results  Component Value Date   WBC 7.1 07/10/2020   HGB 14.0 07/10/2020   HCT 42.2 07/10/2020   MCV 91.7 07/10/2020   PLT 287 07/10/2020   Lab Results  Component Value Date   NA 138 07/10/2020   K 3.7 07/10/2020   CO2 26 07/10/2020   GLUCOSE 148 (H) 07/10/2020   BUN 14 07/10/2020  CREATININE 0.76 07/10/2020   BILITOT 0.6 07/10/2020   ALKPHOS 87 07/04/2019   AST 22 07/10/2020   ALT 17 07/10/2020   PROT 7.0 07/10/2020   ALBUMIN 4.2 07/04/2019   CALCIUM 9.6 07/10/2020   GFR 78.69 07/04/2019   Lab Results  Component Value Date   CHOL 225 (H) 10/29/2015   Lab Results  Component Value Date   HDL 91.90 10/29/2015   Lab Results  Component Value Date   LDLCALC 121 (H) 10/29/2015   Lab Results  Component Value Date   TRIG 58.0 10/29/2015   Lab Results  Component Value Date   CHOLHDL 2 10/29/2015   No results found for: HGBA1C    Assessment & Plan:   Recent episode of acute nausea and vomiting of uncertain etiology.  Evaluation with labs and CT scan unremarkable.  She does not have any red flags such as persistent decline in appetite, fever, hematemesis, or melena.  It does not sound like she was exercising at a level of intensity to explain likely lactic acidosis related to her vomiting  -0bserve for now.  If any recurrent episodes would consider upper GI -Avoid regular use of non-steroidals -Watch for any new symptoms such as melena -We had received a request for surgical referral for hernia but it sounds like her small umbilical hernia which was noted on CT is asymptomatic and do not recommend surgical evaluation at this time  No orders of the defined types were placed in this encounter.   Follow-up: No follow-ups on file.    Carolann Littler, MD

## 2020-07-31 ENCOUNTER — Encounter: Payer: Self-pay | Admitting: Family Medicine

## 2020-08-06 ENCOUNTER — Other Ambulatory Visit: Payer: Self-pay | Admitting: Family Medicine

## 2020-08-29 ENCOUNTER — Ambulatory Visit (INDEPENDENT_AMBULATORY_CARE_PROVIDER_SITE_OTHER): Payer: 59 | Admitting: Podiatry

## 2020-08-29 ENCOUNTER — Other Ambulatory Visit: Payer: Self-pay

## 2020-08-29 DIAGNOSIS — L603 Nail dystrophy: Secondary | ICD-10-CM | POA: Diagnosis not present

## 2020-09-02 ENCOUNTER — Encounter: Payer: Self-pay | Admitting: Podiatry

## 2020-09-02 NOTE — Progress Notes (Signed)
Subjective:  Patient ID: Jackie Hubbard, female    DOB: [birth year],  MRN: [id number]  Chief Complaint  Patient presents with  . Nail Problem    left hallux nail is thick and right foot has a hard place on the side of the big toe     59 y.o. female presents with the above complaint.  Patient presents with complaint of left hallux very dystrophic nail.  Patient states is very painful to touch the pain is across the entire nail.  She states it hurts when ambulating.  She would like to have it removed.  She denies any other acute complaints.  She has not seen anyone else prior to seeing me.  She is tried some various treatment therapies but none of that has helped.   Review of Systems: Negative except as noted in the HPI. Denies N/V/F/Ch.  Past Medical History:  Diagnosis Date  . History of shingles   . LGSIL (low grade squamous intraepithelial dysplasia) 2009  . Osteopenia 12/2013   T score -1.2 FRAX 5.7%/0.3%    Current Outpatient Medications:  .  amoxicillin-clavulanate (AUGMENTIN) 875-125 MG tablet, Take 1 tablet by mouth 2 (two) times daily., Disp: , Rfl:  .  methocarbamol (ROBAXIN) 500 MG tablet, Take 1 tablet (500 mg total) 4 (four) times daily as needed by mouth for muscle spasms. (Patient not taking: Reported on 01/28/2020), Disp: 30 tablet, Rfl: 0 .  naproxen (NAPROSYN) 500 MG tablet, naproxen 500 mg tablet  TAKE 1 TABLET BY MOUTH TWICE DAILY (Patient not taking: Reported on 07/15/2020), Disp: , Rfl:  .  omeprazole (PRILOSEC) 40 MG capsule, TAKE 1 CAPSULE(40 MG) BY MOUTH DAILY, Disp: 30 capsule, Rfl: 0  Social History   Tobacco Use  Smoking Status Former Smoker  Smokeless Tobacco Never Used    Allergies  Allergen Reactions  . Penicillins Other (See Comments)    Reaction as a child   Objective:  There were no vitals filed for this visit. There is no height or weight on file to calculate BMI. Constitutional Well developed. Well nourished.  Vascular Dorsalis pedis  pulses palpable bilaterally. Posterior tibial pulses palpable bilaterally. Capillary refill normal to all digits.  No cyanosis or clubbing noted. Pedal hair growth normal.  Neurologic Normal speech. Oriented to person, place, and time. Epicritic sensation to light touch grossly present bilaterally.  Dermatologic Pain on palpation of the entire/total nail on 1st digit of the left No other open wounds. No skin lesions.  Orthopedic: Normal joint ROM without pain or crepitus bilaterally. No visible deformities. No bony tenderness.   Radiographs: None Assessment:   1. Nail dystrophy    Plan:  Patient was evaluated and treated and all questions answered.  Nail contusion/dystrophy hallux, left -Patient elects to proceed with minor surgery to remove entire toenail today. Consent reviewed and signed by patient. -Entire/total nail excised. See procedure note. -Educated on post-procedure care including soaking. Written instructions provided and reviewed. -Patient to follow up in 2 weeks for nail check.  Procedure: Excision of entire/total nail  Location: Left 1st toe digit Anesthesia: Lidocaine 1% plain; 1.5 mL and Marcaine 0.5% plain; 1.5 mL, digital block. Skin Prep: Betadine. Dressing: Silvadene; telfa; dry, sterile, compression dressing. Technique: Following skin prep, the toe was exsanguinated and a tourniquet was secured at the base of the toe. The affected nail border was freed and excised. The tourniquet was then removed and sterile dressing applied. Disposition: Patient tolerated procedure well. Patient to return in 2 weeks for follow-up.  No follow-ups on file.

## 2020-09-15 NOTE — H&P (Signed)
TOTAL KNEE ADMISSION H&P  Patient is being admitted for left total knee arthroplasty.  Subjective:  Chief Complaint: Left knee pain.  HPI: Jackie Hubbard, 59 y.o. female has a history of pain and functional disability in the left knee due to arthritis and has failed non-surgical conservative treatments for greater than 12 weeks to include corticosteriod injections and activity modification. Onset of symptoms was gradual, starting several years ago with gradually worsening course since that time. The patient noted no past surgery on the left knee.  Patient currently rates pain in the left knee at 7 out of 10 with activity. Patient has worsening of pain with activity and weight bearing, pain that interferes with activities of daily living, crepitus and joint swelling. Patient has evidence of bone-on-bone medial and near bone-on-bone patellofemoral by imaging studies. There is no active infection.  Patient Active Problem List   Diagnosis Date Noted  . UNSPECIFIED SUPPURATIVE OTITIS MEDIA 05/06/2010  . LIPOMA 03/27/2010  . JOINT EFFUSION, KNEE 03/27/2010  . LATERAL EPICONDYLITIS 03/27/2010  . DERMATOFIBROMA 03/19/2009    Past Medical History:  Diagnosis Date  . History of shingles   . LGSIL (low grade squamous intraepithelial dysplasia) 2009  . Osteopenia 12/2013   T score -1.2 FRAX 5.7%/0.3%    Past Surgical History:  Procedure Laterality Date  . AUGMENTATION MAMMAPLASTY Bilateral   . BLADDER SUSPENSION    . COLONOSCOPY  04/29/09   [place]  . ROTATOR CUFF REPAIR      Prior to Admission medications   Medication Sig Start Date End Date Taking? Authorizing Provider  acetaminophen (TYLENOL) 325 MG tablet Take 650 mg by mouth every 6 (six) hours as needed for moderate pain.   Yes [provider]  ibuprofen (ADVIL) 200 MG tablet Take 400 mg by mouth every 8 (eight) hours as needed for moderate pain.   Yes [provider]  Multiple Vitamins-Minerals (MULTIVITAMIN  WITH MINERALS) tablet Take 1 tablet by mouth daily.   Yes [provider]    Allergies  Allergen Reactions  . Penicillins Other (See Comments)    Reaction as a child    Social History   Socioeconomic History  . Marital status: Married    Spouse name: Not on file  . Number of children: Not on file  . Years of education: Not on file  . Highest education level: Not on file  Occupational History  . Not on file  Tobacco Use  . Smoking status: Former Research scientist (life sciences)  . Smokeless tobacco: Never Used  Vaping Use  . Vaping Use: Never used  Substance and Sexual Activity  . Alcohol use: Not Currently  . Drug use: No  . Sexual activity: Yes    Birth control/protection: Post-menopausal    Comment: 1st intercourse 59 yo-More than 5 partners  Other Topics Concern  . Not on file  Social History Narrative  . Not on file   Social Determinants of Health   Financial Resource Strain:   . Difficulty of Paying Living Expenses: Not on file  Food Insecurity:   . Worried About Charity fundraiser in the Last Year: Not on file  . Ran Out of Food in the Last Year: Not on file  Transportation Needs:   . Lack of Transportation (Medical): Not on file  . Lack of Transportation (Non-Medical): Not on file  Physical Activity:   . Days of Exercise per Week: Not on file  . Minutes of Exercise per Session: Not on file  Stress:   .  Feeling of Stress : Not on file  Social Connections:   . Frequency of Communication with Friends and Family: Not on file  . Frequency of Social Gatherings with Friends and Family: Not on file  . Attends Religious Services: Not on file  . Active Member of Clubs or Organizations: Not on file  . Attends Archivist Meetings: Not on file  . Marital Status: Not on file  Intimate Partner Violence:   . Fear of Current or Ex-Partner: Not on file  . Emotionally Abused: Not on file  . Physically Abused: Not on file  . Sexually Abused: Not on file      Tobacco Use:  Medium Risk  . Smoking Tobacco Use: Former Smoker  . Smokeless Tobacco Use: Never Used   Social History   Substance and Sexual Activity  Alcohol Use Not Currently    Family History  Problem Relation Age of Onset  . Leukemia Mother   . Arthritis Father   . Breast cancer Neg Hx     Review of Systems  Constitutional: Negative for chills and fever.  HENT: Negative for congestion, sore throat and tinnitus.   Eyes: Negative for double vision, photophobia and pain.  Respiratory: Negative for cough, shortness of breath and wheezing.   Cardiovascular: Negative for chest pain, palpitations and orthopnea.  Gastrointestinal: Negative for heartburn, nausea and vomiting.  Genitourinary: Negative for dysuria, frequency and urgency.  Musculoskeletal: Positive for joint pain.  Neurological: Negative for dizziness, weakness and headaches.    Objective:  Physical Exam: Well nourished and well developed.  General: Alert and oriented x3, cooperative and pleasant, no acute distress.  Head: normocephalic, atraumatic, neck supple.  Eyes: EOMI.  Respiratory: breath sounds clear in all fields, no wheezing, rales, or rhonchi. Cardiovascular: Regular rate and rhythm, no murmurs, gallops or rubs.  Abdomen: non-tender to palpation and soft, normoactive bowel sounds. Musculoskeletal:  Left Knee Exam:  No effusion present. No swelling present.  The Range of motion is: 0 to 125 degrees.  No crepitus on range of motion of the knee.  No medial joint line tenderness  No lateral joint line tenderness.  The knee is stable.   Calves soft and nontender. Motor function intact in LE. Strength 5/5 LE bilaterally. Neuro: Distal pulses 2+. Sensation to light touch intact in LE.  Imaging Review Plain radiographs demonstrate severe degenerative joint disease of the left knee. The overall alignment is neutral. The bone quality appears to be adequate for age and reported activity  level.  Assessment/Plan:  End stage arthritis, left knee   The patient history, physical examination, clinical judgment of the provider and imaging studies are consistent with end stage degenerative joint disease of the left knee and total knee arthroplasty is deemed medically necessary. The treatment options including medical management, injection therapy arthroscopy and arthroplasty were discussed at length. The risks and benefits of total knee arthroplasty were presented and reviewed. The risks due to aseptic loosening, infection, stiffness, patella tracking problems, thromboembolic complications and other imponderables were discussed. The patient acknowledged the explanation, agreed to proceed with the plan and consent was signed. Patient is being admitted for inpatient treatment for surgery, pain control, PT, OT, prophylactic antibiotics, VTE prophylaxis, progressive ambulation and ADLs and discharge planning. The patient is planning to be discharged home  Patient's anticipated LOS is less than 2 midnights, meeting these requirements: - Younger than 32 - Lives within 1 hour of care - Has a competent adult at home to recover with post-op  recover - NO history of  - Chronic pain requiring opiods  - Diabetes  - Coronary Artery Disease  - Heart failure  - Heart attack  - Stroke  - DVT/VTE  - Cardiac arrhythmia  - Respiratory Failure/COPD  - Renal failure  - Anemia  - Advanced Liver disease  Therapy Plans: Outpatient therapy at [facility] Disposition: Home with husband Planned DVT Prophylaxis: Aspirin 325 mg BID DME Needed: [facility] Rile PCP: Carolann Littler, MD (clearance received) TXA: IV Allergies: NKDA Anesthesia Concerns: None BMI: 22.3 Last HgbA1c: Not diabetic.  Pharmacy: [facility] ([place])  - Patient was instructed on what medications to stop prior to surgery. - Follow-up visit in 2 weeks with Dr. Wynelle Link - Begin physical therapy following surgery - Pre-operative lab  work as pre-surgical testing - Prescriptions will be provided in hospital at time of discharge  Theresa Duty, PA-C Orthopedic Surgery [facility] [place]

## 2020-09-16 NOTE — Patient Instructions (Addendum)
DUE TO COVID-19 ONLY ONE VISITOR IS ALLOWED TO COME WITH YOU AND STAY IN THE WAITING ROOM ONLY DURING PRE OP AND PROCEDURE DAY OF SURGERY. THE 1 VISITOR  MAY VISIT WITH YOU AFTER SURGERY IN YOUR PRIVATE ROOM DURING VISITING HOURS ONLY!  YOU NEED TO HAVE A COVID 19 TEST ON__12/9_____ @_3 :00______, THIS TEST MUST BE DONE BEFORE SURGERY,  COVID TESTING SITE [place] [address], IT IS ON THE RIGHT GOING OUT [address] APPROXIMATELY  2 MINUTES PAST [employer] ON THE RIGHT. ONCE YOUR COVID TEST IS COMPLETED,  PLEASE BEGIN THE QUARANTINE INSTRUCTIONS AS OUTLINED IN YOUR HANDOUT.                Jackie Hubbard    Your procedure is scheduled on: 09/22/20   Report to [facility] Main  Entrance   Report to admitting at 9:05 AM     Call this number if you have problems the morning of surgery [phone]    . BRUSH YOUR TEETH MORNING OF SURGERY AND RINSE YOUR MOUTH OUT, NO CHEWING GUM CANDY OR MINTS.   No food after midnight.    You may have clear liquid until 8:30 AM    CLEAR LIQUID DIET   Foods Allowed                                                                     Foods Excluded  Coffee and tea, regular and decaf                             liquids that you cannot  Plain Jell-O any favor except red or purple                                           see through such as: Fruit ices (not with fruit pulp)                                     milk, soups, orange juice  Iced Popsicles                                    All solid food Carbonated beverages, regular and diet                                    Cranberry, grape and apple juices Sports drinks like Gatorade Lightly seasoned clear broth or consume(fat free) Sugar, honey syrup      .  At 8:00  AM drink pre surgery drink  . Nothing by mouth after 8:30 AM.   Take these medicines the morning of surgery with A SIP OF WATER: None                               You may not  have  any metal on your body including hair pins and              piercings  Do not wear jewelry, make-up, lotions, powders or perfumes, deodorant             Do not wear nail polish on your fingernails.  Do not shave  48 hours prior to surgery.     Do ve face and neck.not bring valuables to the hospital. [place].  Contacts, dentures or bridgework may not be worn into surgery.                 Please read over the following fact sheets you were given: _____________________________________________________________________             [facility] - Preparing for Surgery Before surgery, you can play an important role.   Because skin is not sterile, your skin needs to be as free of germs as possible.   You can reduce the number of germs on your skin by washing with CHG (chlorahexidine gluconate) soap before surgery.   CHG is an antiseptic cleaner which kills germs and bonds with the skin to continue killing germs even after washing. Please DO NOT use if you have an allergy to CHG or antibacterial soaps.   If your skin becomes reddened/irritated stop using the CHG and inform your nurse when you arrive at Short Stay. Do not shave (including legs and underarms) for at least 48 hours prior to the first CHG shower.    Please follow these instructions carefully:  1.  Shower with CHG Soap the night before surgery and the  morning of Surgery.  2.  If you choose to wash your hair, wash your hair first as usual with your  normal  shampoo.  3.  After you shampoo, rinse your hair and body thoroughly to remove the  shampoo.                                        4.  Use CHG as you would any other liquid soap.  You can apply chg directly  to the skin and wash                       Gently with a scrungie or clean washcloth.  5.  Apply the CHG Soap to your body ONLY FROM THE NECK DOWN.   Do not use on face/ open                           Wound or open sores.  Avoid contact with eyes, ears mouth and genitals (private parts).                       Wash face,  Genitals (private parts) with your normal soap.             6.  Wash thoroughly, paying special attention to the area where your surgery  will be performed.  7.  Thoroughly rinse your body with warm water from the neck down.  8.  DO NOT shower/wash with your normal soap after using and rinsing off  the CHG Soap.  9.  Pat yourself dry with a clean towel.            10.  Wear clean pajamas.            11.  Place clean sheets on your bed the night of your first shower and do not  sleep with pets. Day of Surgery : Do not apply any lotions/deodorants the morning of surgery.  Please wear clean clothes to the hospital/surgery center.  FAILURE TO FOLLOW THESE INSTRUCTIONS MAY RESULT IN THE CANCELLATION OF YOUR SURGERY PATIENT SIGNATURE_________________________________  NURSE SIGNATURE__________________________________  ________________________________________________________________________   Jackie Hubbard  An incentive spirometer is a tool that can help keep your lungs clear and active. This tool measures how well you are filling your lungs with each breath. Taking long deep breaths may help reverse or decrease the chance of developing breathing (pulmonary) problems (especially infection) following:  A long period of time when you are unable to move or be active. BEFORE THE PROCEDURE   If the spirometer includes an indicator to show your best effort, your nurse or respiratory therapist will set it to a desired goal.  If possible, sit up straight or lean slightly forward. Try not to slouch.  Hold the incentive spirometer in an upright position. INSTRUCTIONS FOR USE  1. Sit on the edge of your bed if possible, or sit up as far as you can in bed or on a chair. 2. Hold the incentive spirometer in an upright position. 3. Breathe out normally. 4. Place the mouthpiece in your  mouth and seal your lips tightly around it. 5. Breathe in slowly and as deeply as possible, raising the piston or the ball toward the top of the column. 6. Hold your breath for 3-5 seconds or for as long as possible. Allow the piston or ball to fall to the bottom of the column. 7. Remove the mouthpiece from your mouth and breathe out normally. 8. Rest for a few seconds and repeat Steps 1 through 7 at least 10 times every 1-2 hours when you are awake. Take your time and take a few normal breaths between deep breaths. 9. The spirometer may include an indicator to show your best effort. Use the indicator as a goal to work toward during each repetition. 10. After each set of 10 deep breaths, practice coughing to be sure your lungs are clear. If you have an incision (the cut made at the time of surgery), support your incision when coughing by placing a pillow or rolled up towels firmly against it. Once you are able to get out of bed, walk around indoors and cough well. You may stop using the incentive spirometer when instructed by your caregiver.  RISKS AND COMPLICATIONS  Take your time so you do not get dizzy or light-headed.  If you are in pain, you may need to take or ask for pain medication before doing incentive spirometry. It is harder to take a deep breath if you are having pain. AFTER USE  Rest and breathe slowly and easily.  It can be helpful to keep track of a log of your progress. Your caregiver can provide you with a simple table to help with this. If you are using the spirometer at home, follow these instructions: [place] IF:   You are having difficultly using the spirometer.  You have trouble using the spirometer as often as instructed.  Your pain medication is not giving enough relief while using the spirometer.  You develop  fever of 100.5 F (38.1 C) or higher. SEEK IMMEDIATE MEDICAL CARE IF:   You cough up bloody sputum that had not been present before.  You  develop fever of 102 F (38.9 C) or greater.  You develop worsening pain at or near the incision site. MAKE SURE YOU:   Understand these instructions.  Will watch your condition.  Will get help right away if you are not doing well or get worse. Document Released: 02/07/2007 Document Revised: 12/20/2011 Document Reviewed: 04/10/2007 [facility] Patient Information 2014 [place], [place].   ________________________________________________________________________

## 2020-09-18 ENCOUNTER — Other Ambulatory Visit: Payer: Self-pay

## 2020-09-18 ENCOUNTER — Encounter (HOSPITAL_COMMUNITY): Payer: Self-pay

## 2020-09-18 ENCOUNTER — Other Ambulatory Visit (HOSPITAL_COMMUNITY)
Admission: RE | Admit: 2020-09-18 | Discharge: 2020-09-18 | Disposition: A | Discharge status: Home / Self Care | Payer: 59 | Source: Ambulatory Visit | Attending: Orthopedic Surgery | Admitting: Orthopedic Surgery

## 2020-09-18 ENCOUNTER — Encounter (HOSPITAL_COMMUNITY)
Admission: RE | Admit: 2020-09-18 | Discharge: 2020-09-18 | Disposition: A | Discharge status: Home / Self Care | Payer: 59 | Source: Ambulatory Visit | Attending: Orthopedic Surgery | Admitting: Orthopedic Surgery

## 2020-09-18 DIAGNOSIS — Z20822 Contact with and (suspected) exposure to covid-19: Secondary | ICD-10-CM | POA: Insufficient documentation

## 2020-09-18 DIAGNOSIS — Z01812 Encounter for preprocedural laboratory examination: Secondary | ICD-10-CM | POA: Insufficient documentation

## 2020-09-18 HISTORY — DX: Unspecified osteoarthritis, unspecified site: M19.90

## 2020-09-18 LAB — CBC
HCT: 42.4 % (ref 36.0–46.0)
Hemoglobin: 14.4 g/dL (ref 12.0–15.0)
MCH: 31.2 pg (ref 26.0–34.0)
MCHC: 34 g/dL (ref 30.0–36.0)
MCV: 91.8 fL (ref 80.0–100.0)
Platelets: 259 10*3/uL (ref 150–400)
RBC: 4.62 MIL/uL (ref 3.87–5.11)
RDW: 12.4 % (ref 11.5–15.5)
WBC: 8.9 10*3/uL (ref 4.0–10.5)
nRBC: 0 % (ref 0.0–0.2)

## 2020-09-18 LAB — COMPREHENSIVE METABOLIC PANEL
ALT: 26 U/L (ref 0–44)
AST: 27 U/L (ref 15–41)
Albumin: 4.4 g/dL (ref 3.5–5.0)
Alkaline Phosphatase: 67 U/L (ref 38–126)
Anion gap: 12 (ref 5–15)
BUN: 19 mg/dL (ref 8–23)
CO2: 28 mmol/L (ref 22–32)
Calcium: 9.8 mg/dL (ref 8.9–10.3)
Chloride: 101 mmol/L (ref 98–111)
Creatinine, Ser: 0.71 mg/dL (ref 0.44–1.00)
GFR, Estimated: >60 mL/min (ref >60)
Glucose, Bld: 109 mg/dL — ABNORMAL HIGH (ref 70–99)
Potassium: 4.6 mmol/L (ref 3.5–5.1)
Sodium: 141 mmol/L (ref 135–145)
Total Bilirubin: 0.7 mg/dL (ref 0.3–1.2)
Total Protein: 7.5 g/dL (ref 6.5–8.1)

## 2020-09-18 LAB — SURGICAL PCR SCREEN
MRSA, PCR: NEGATIVE
Staphylococcus aureus: NEGATIVE

## 2020-09-18 LAB — PROTIME-INR
INR: 1 (ref 0.8–1.2)
Prothrombin Time: 12.3 seconds (ref 11.4–15.2)

## 2020-09-18 LAB — APTT: aPTT: 28 seconds (ref 24–36)

## 2020-09-18 NOTE — Progress Notes (Signed)
COVID Vaccine Completed:Yes Date COVID Vaccine completed:01/14/20 booster 07/31/20 COVID vaccine manufacturer: [employer]     PCP - Dr.B. Burchette  Cardiologist - none  Chest x-ray - no EKG - no Stress Test - no ECHO - no Cardiac Cath - no Pacemaker/ICD device last checked:NA  Sleep Study - no CPAP -   Fasting Blood Sugar - NA Checks Blood Sugar _____ times a day  Blood Thinner Instructions:NA Aspirin Instructions: Last Dose:  Anesthesia review:   Patient denies shortness of breath, fever, cough and chest pain at PAT appointment yes  Patient verbalized understanding of instructions that were given to them at the PAT appointment. Patient was also instructed that they will need to review over the PAT instructions again at home before surgery. Yes Pt works out regularly and has no SOB with activities.

## 2020-09-19 ENCOUNTER — Telehealth (INDEPENDENT_AMBULATORY_CARE_PROVIDER_SITE_OTHER): Payer: 59 | Admitting: Family Medicine

## 2020-09-19 DIAGNOSIS — R059 Cough, unspecified: Secondary | ICD-10-CM | POA: Diagnosis not present

## 2020-09-19 LAB — SARS CORONAVIRUS 2 (TAT 6-24 HRS): SARS Coronavirus 2: NEGATIVE

## 2020-09-19 NOTE — Progress Notes (Signed)
Patient ID: Jackie Hubbard, female   DOB: 01-13-1961, 59 y.o.   MRN: [id number]  This visit type was conducted due to national recommendations for restrictions regarding the COVID-19 pandemic in an effort to limit this patient's exposure and mitigate transmission in our community.   Virtual Visit via Video Note  I connected with [name] on 09/19/20 at  2:00 PM EST by a video enabled telemedicine application and verified that I am speaking with the correct person using two identifiers.  Location patient: home Location provider:work or home office Persons participating in the virtual visit: patient, provider  I discussed the limitations of evaluation and management by telemedicine and the availability of in person appointments. The patient expressed understanding and agreed to proceed.   HPI: [name] called with cough and upper respiratory symptoms for the past week. She states she developed typical cold-like symptoms. She really does not feel that sick and in fact worked out couple times lightly this week. Her major concern is that she is scheduled for knee replacement on Monday. She had Covid test which was negative. She has been taking some Mucinex DM which helps her cough. Her cough is now productive. She is not aware of any wheezing. No fevers or chills. She had preop labs done yesterday and these were reviewed. White blood count normal. Hemoglobin normal. Chemistries normal.  ROS: See pertinent positives and negatives per HPI.  Past Medical History:  Diagnosis Date  . Arthritis    knees  . History of shingles   . LGSIL (low grade squamous intraepithelial dysplasia) 2009  . Osteopenia 12/2013   T score -1.2 FRAX 5.7%/0.3%    Past Surgical History:  Procedure Laterality Date  . AUGMENTATION MAMMAPLASTY Bilateral   . BLADDER SUSPENSION    . COLONOSCOPY  04/29/09   [place]  . ROTATOR CUFF REPAIR      Family History  Problem Relation Age of Onset  . Leukemia Mother    . Arthritis Father   . Breast cancer Neg Hx     SOCIAL HX: Non-smoker   Current Outpatient Medications:  .  acetaminophen (TYLENOL) 325 MG tablet, Take 650 mg by mouth every 6 (six) hours as needed for moderate pain., Disp: , Rfl:  .  ibuprofen (ADVIL) 200 MG tablet, Take 400 mg by mouth every 8 (eight) hours as needed for moderate pain., Disp: , Rfl:  .  Multiple Vitamins-Minerals (MULTIVITAMIN WITH MINERALS) tablet, Take 1 tablet by mouth daily., Disp: , Rfl:   EXAM:  VITALS per patient if applicable:  GENERAL: alert, oriented, appears well and in no acute distress  HEENT: atraumatic, conjunttiva clear, no obvious abnormalities on inspection of external nose and ears  NECK: normal movements of the head and neck  LUNGS: on inspection no signs of respiratory distress, breathing rate appears normal, no obvious gross SOB, gasping or wheezing  CV: no obvious cyanosis  MS: moves all visible extremities without noticeable abnormality  PSYCH/NEURO: pleasant and cooperative, no obvious depression or anxiety, speech and thought processing grossly intact  ASSESSMENT AND PLAN:  Discussed the following assessment and plan:  Cough probably secondary to acute viral bronchitis. Covid test negative. She is in no respiratory distress.  -Continue with Mucinex DM -Continue plenty of fluids and rest -No clear indication for antibiotics at this time and would avoid prednisone if possible with her upcoming surgery -Follow-up immediately for any fever or other changes     I discussed the assessment and treatment plan with the patient. The patient  was provided an opportunity to ask questions and all were answered. The patient agreed with the plan and demonstrated an understanding of the instructions.   The patient was advised to call back or seek an in-person evaluation if the symptoms worsen or if the condition fails to improve as anticipated.     Carolann Littler, MD

## 2020-09-21 MED ORDER — BUPIVACAINE LIPOSOME 1.3 % IJ SUSP
20.0000 mL | Freq: Once | INTRAMUSCULAR | Status: DC
  Filled 2020-09-21: qty 20

## 2020-09-22 ENCOUNTER — Encounter (HOSPITAL_COMMUNITY): Payer: Self-pay | Admitting: Orthopedic Surgery

## 2020-09-22 ENCOUNTER — Observation Stay (HOSPITAL_COMMUNITY)
Admission: RE | Admit: 2020-09-22 | Discharge: 2020-09-23 | Disposition: A | Discharge status: Home / Self Care | Payer: 59 | Attending: Orthopedic Surgery | Admitting: Orthopedic Surgery

## 2020-09-22 ENCOUNTER — Other Ambulatory Visit: Payer: Self-pay

## 2020-09-22 ENCOUNTER — Ambulatory Visit (HOSPITAL_COMMUNITY): Payer: 59 | Admitting: Physician Assistant

## 2020-09-22 ENCOUNTER — Encounter (HOSPITAL_COMMUNITY)
Admission: RE | Disposition: A | Discharge status: Home / Self Care | Payer: Self-pay | Source: Home / Self Care | Attending: Orthopedic Surgery

## 2020-09-22 DIAGNOSIS — M1712 Unilateral primary osteoarthritis, left knee: Principal | ICD-10-CM | POA: Diagnosis present

## 2020-09-22 DIAGNOSIS — Z87891 Personal history of nicotine dependence: Secondary | ICD-10-CM | POA: Insufficient documentation

## 2020-09-22 DIAGNOSIS — M25562 Pain in left knee: Secondary | ICD-10-CM | POA: Diagnosis present

## 2020-09-22 DIAGNOSIS — M179 Osteoarthritis of knee, unspecified: Secondary | ICD-10-CM

## 2020-09-22 HISTORY — PX: TOTAL KNEE ARTHROPLASTY: SHX125

## 2020-09-22 LAB — TYPE AND SCREEN
ABO/RH(D): O POS
Antibody Screen: NEGATIVE

## 2020-09-22 LAB — ABO/RH: ABO/RH(D): O POS

## 2020-09-22 SURGERY — ARTHROPLASTY, KNEE, TOTAL
Anesthesia: Spinal | Site: Knee | Laterality: Left

## 2020-09-22 MED ORDER — ACETAMINOPHEN 10 MG/ML IV SOLN
1000.0000 mg | Freq: Four times a day (QID) | INTRAVENOUS | Status: DC
  Administered 2020-09-22: 13:00:00 1000 mg via INTRAVENOUS
  Filled 2020-09-22: qty 100

## 2020-09-22 MED ORDER — METOCLOPRAMIDE HCL 5 MG PO TABS: 5.0000 mg | ORAL_TABLET | Freq: Three times a day (TID) | ORAL | Status: DC | PRN

## 2020-09-22 MED ORDER — MORPHINE SULFATE (PF) 2 MG/ML IV SOLN: 0.5000 mg | INTRAVENOUS | Status: DC | PRN

## 2020-09-22 MED ORDER — PROPOFOL 10 MG/ML IV BOLUS
INTRAVENOUS | Status: DC | PRN
  Administered 2020-09-22 (×2): 40 mg via INTRAVENOUS
  Administered 2020-09-22 (×2): 20 mg via INTRAVENOUS
  Administered 2020-09-22: 40 mg via INTRAVENOUS

## 2020-09-22 MED ORDER — ONDANSETRON HCL 4 MG/2ML IJ SOLN: 4.0000 mg | Freq: Four times a day (QID) | INTRAMUSCULAR | Status: DC | PRN

## 2020-09-22 MED ORDER — BUPIVACAINE HCL (PF) 0.5 % IJ SOLN
INTRAMUSCULAR | Status: DC | PRN
  Administered 2020-09-22: 20 mL via PERINEURAL

## 2020-09-22 MED ORDER — DEXAMETHASONE SODIUM PHOSPHATE 10 MG/ML IJ SOLN
8.0000 mg | Freq: Once | INTRAMUSCULAR | Status: AC
  Administered 2020-09-22: 12:00:00 8 mg via INTRAVENOUS

## 2020-09-22 MED ORDER — PROPOFOL 500 MG/50ML IV EMUL
INTRAVENOUS | Status: DC | PRN
  Administered 2020-09-22: 100 ug/kg/min via INTRAVENOUS

## 2020-09-22 MED ORDER — MENTHOL 3 MG MT LOZG: 1.0000 | LOZENGE | OROMUCOSAL | Status: DC | PRN

## 2020-09-22 MED ORDER — SODIUM CHLORIDE 0.9 % IR SOLN
Status: DC | PRN
  Administered 2020-09-22: 1000 mL

## 2020-09-22 MED ORDER — LACTATED RINGERS IV SOLN: INTRAVENOUS | Status: DC

## 2020-09-22 MED ORDER — SODIUM CHLORIDE (PF) 0.9 % IJ SOLN
INTRAMUSCULAR | Status: DC | PRN
  Administered 2020-09-22: 60 mL

## 2020-09-22 MED ORDER — ASPIRIN EC 325 MG PO TBEC
325.0000 mg | DELAYED_RELEASE_TABLET | Freq: Two times a day (BID) | ORAL | Status: DC
  Administered 2020-09-23: 09:00:00 325 mg via ORAL
  Filled 2020-09-22: qty 1

## 2020-09-22 MED ORDER — TRAMADOL HCL 50 MG PO TABS
50.0000 mg | ORAL_TABLET | Freq: Four times a day (QID) | ORAL | Status: DC | PRN
  Administered 2020-09-22: 100 mg via ORAL
  Filled 2020-09-22: qty 2

## 2020-09-22 MED ORDER — STERILE WATER FOR IRRIGATION IR SOLN
Status: DC | PRN
Start: 2020-09-22 — End: 2020-09-22
  Administered 2020-09-22: 2000 mL

## 2020-09-22 MED ORDER — MIDAZOLAM HCL 2 MG/2ML IJ SOLN
1.0000 mg | Freq: Once | INTRAMUSCULAR | Status: AC
  Administered 2020-09-22: 11:00:00 2 mg via INTRAVENOUS
  Filled 2020-09-22: qty 2

## 2020-09-22 MED ORDER — SODIUM CHLORIDE (PF) 0.9 % IJ SOLN
INTRAMUSCULAR | Status: AC
  Filled 2020-09-22: qty 50

## 2020-09-22 MED ORDER — METOCLOPRAMIDE HCL 5 MG/ML IJ SOLN: 5.0000 mg | Freq: Three times a day (TID) | INTRAMUSCULAR | Status: DC | PRN

## 2020-09-22 MED ORDER — CEFAZOLIN SODIUM-DEXTROSE 2-4 GM/100ML-% IV SOLN
2.0000 g | Freq: Four times a day (QID) | INTRAVENOUS | Status: AC
  Administered 2020-09-22 (×2): 2 g via INTRAVENOUS
  Filled 2020-09-22 (×2): qty 100

## 2020-09-22 MED ORDER — SODIUM CHLORIDE (PF) 0.9 % IJ SOLN
INTRAMUSCULAR | Status: AC
  Filled 2020-09-22: qty 10

## 2020-09-22 MED ORDER — DIPHENHYDRAMINE HCL 12.5 MG/5ML PO ELIX: 12.5000 mg | ORAL_SOLUTION | ORAL | Status: DC | PRN

## 2020-09-22 MED ORDER — DOCUSATE SODIUM 100 MG PO CAPS
100.0000 mg | ORAL_CAPSULE | Freq: Two times a day (BID) | ORAL | Status: DC
  Administered 2020-09-22 – 2020-09-23 (×2): 100 mg via ORAL
  Filled 2020-09-22 (×2): qty 1

## 2020-09-22 MED ORDER — DEXAMETHASONE SODIUM PHOSPHATE 10 MG/ML IJ SOLN
INTRAMUSCULAR | Status: AC
  Filled 2020-09-22: qty 1

## 2020-09-22 MED ORDER — ONDANSETRON HCL 4 MG PO TABS: 4.0000 mg | ORAL_TABLET | Freq: Four times a day (QID) | ORAL | Status: DC | PRN

## 2020-09-22 MED ORDER — EPHEDRINE SULFATE-NACL 50-0.9 MG/10ML-% IV SOSY
PREFILLED_SYRINGE | INTRAVENOUS | Status: DC | PRN
  Administered 2020-09-22 (×2): 10 mg via INTRAVENOUS

## 2020-09-22 MED ORDER — ONDANSETRON HCL 4 MG/2ML IJ SOLN: 4.0000 mg | Freq: Once | INTRAMUSCULAR | Status: DC | PRN

## 2020-09-22 MED ORDER — GABAPENTIN 300 MG PO CAPS
300.0000 mg | ORAL_CAPSULE | Freq: Three times a day (TID) | ORAL | Status: DC
  Administered 2020-09-22 – 2020-09-23 (×3): 300 mg via ORAL
  Filled 2020-09-22 (×3): qty 1

## 2020-09-22 MED ORDER — ONDANSETRON HCL 4 MG/2ML IJ SOLN
INTRAMUSCULAR | Status: DC | PRN
  Administered 2020-09-22: 4 mg via INTRAVENOUS

## 2020-09-22 MED ORDER — DEXAMETHASONE SODIUM PHOSPHATE 10 MG/ML IJ SOLN
10.0000 mg | Freq: Once | INTRAMUSCULAR | Status: AC
  Administered 2020-09-23: 08:00:00 10 mg via INTRAVENOUS
  Filled 2020-09-22: qty 1

## 2020-09-22 MED ORDER — PHENOL 1.4 % MT LIQD: 1.0000 | OROMUCOSAL | Status: DC | PRN

## 2020-09-22 MED ORDER — FENTANYL CITRATE (PF) 100 MCG/2ML IJ SOLN
50.0000 ug | Freq: Once | INTRAMUSCULAR | Status: AC
  Administered 2020-09-22: 11:00:00 50 ug via INTRAVENOUS
  Filled 2020-09-22: qty 2

## 2020-09-22 MED ORDER — TRANEXAMIC ACID-NACL 1000-0.7 MG/100ML-% IV SOLN
1000.0000 mg | INTRAVENOUS | Status: AC
  Administered 2020-09-22: 12:00:00 1000 mg via INTRAVENOUS
  Filled 2020-09-22: qty 100

## 2020-09-22 MED ORDER — POVIDONE-IODINE 10 % EX SWAB
2.0000 "application " | Freq: Once | CUTANEOUS | Status: AC
  Administered 2020-09-22: 2 via TOPICAL

## 2020-09-22 MED ORDER — ORAL CARE MOUTH RINSE: 15.0000 mL | Freq: Once | OROMUCOSAL | Status: AC

## 2020-09-22 MED ORDER — SODIUM CHLORIDE 0.9 % IV SOLN: INTRAVENOUS | Status: DC

## 2020-09-22 MED ORDER — PROPOFOL 1000 MG/100ML IV EMUL
INTRAVENOUS | Status: AC
  Filled 2020-09-22: qty 100

## 2020-09-22 MED ORDER — ACETAMINOPHEN 500 MG PO TABS
1000.0000 mg | ORAL_TABLET | Freq: Four times a day (QID) | ORAL | Status: AC
  Administered 2020-09-22 – 2020-09-23 (×4): 1000 mg via ORAL
  Filled 2020-09-22 (×4): qty 2

## 2020-09-22 MED ORDER — POLYETHYLENE GLYCOL 3350 17 G PO PACK: 17.0000 g | PACK | Freq: Every day | ORAL | Status: DC | PRN

## 2020-09-22 MED ORDER — METHOCARBAMOL 500 MG IVPB - SIMPLE MED
500.0000 mg | Freq: Four times a day (QID) | INTRAVENOUS | Status: DC | PRN
  Filled 2020-09-22: qty 50

## 2020-09-22 MED ORDER — BUPIVACAINE IN DEXTROSE 0.75-8.25 % IT SOLN
INTRATHECAL | Status: DC | PRN
Start: 2020-09-22 — End: 2020-09-22
  Administered 2020-09-22: 1.6 mL via INTRATHECAL

## 2020-09-22 MED ORDER — OXYCODONE HCL 5 MG PO TABS
5.0000 mg | ORAL_TABLET | ORAL | Status: DC | PRN
  Administered 2020-09-22 – 2020-09-23 (×3): 5 mg via ORAL
  Filled 2020-09-22 (×3): qty 1

## 2020-09-22 MED ORDER — CEFAZOLIN SODIUM-DEXTROSE 2-4 GM/100ML-% IV SOLN
2.0000 g | INTRAVENOUS | Status: AC
  Administered 2020-09-22: 12:00:00 2 g via INTRAVENOUS
  Filled 2020-09-22: qty 100

## 2020-09-22 MED ORDER — METHOCARBAMOL 500 MG PO TABS
500.0000 mg | ORAL_TABLET | Freq: Four times a day (QID) | ORAL | Status: DC | PRN
  Administered 2020-09-23: 06:00:00 500 mg via ORAL
  Filled 2020-09-22: qty 1

## 2020-09-22 MED ORDER — FLEET ENEMA 7-19 GM/118ML RE ENEM: 1.0000 | ENEMA | Freq: Once | RECTAL | Status: DC | PRN

## 2020-09-22 MED ORDER — ONDANSETRON HCL 4 MG/2ML IJ SOLN
INTRAMUSCULAR | Status: AC
  Filled 2020-09-22: qty 2

## 2020-09-22 MED ORDER — BISACODYL 10 MG RE SUPP: 10.0000 mg | Freq: Every day | RECTAL | Status: DC | PRN

## 2020-09-22 MED ORDER — CHLORHEXIDINE GLUCONATE 0.12 % MT SOLN
15.0000 mL | Freq: Once | OROMUCOSAL | Status: AC
  Administered 2020-09-22: 10:00:00 15 mL via OROMUCOSAL

## 2020-09-22 MED ORDER — HYDROMORPHONE HCL 1 MG/ML IJ SOLN: 0.2500 mg | INTRAMUSCULAR | Status: DC | PRN

## 2020-09-22 MED ORDER — BUPIVACAINE LIPOSOME 1.3 % IJ SUSP
INTRAMUSCULAR | Status: DC | PRN
  Administered 2020-09-22: 20 mL

## 2020-09-22 MED ORDER — 0.9 % SODIUM CHLORIDE (POUR BTL) OPTIME
TOPICAL | Status: DC | PRN
  Administered 2020-09-22: 12:00:00 1000 mL

## 2020-09-22 SURGICAL SUPPLY — 52 items
ATTUNE PSFEM LTSZ5 NARCEM KNEE (Femur) ×3 IMPLANT
ATTUNE PSRP INSR SZ5 8 KNEE (Insert) ×2 IMPLANT
ATTUNE PSRP INSR SZ5 8MM KNEE (Insert) ×1 IMPLANT
BAG ZIPLOCK 12X15 (MISCELLANEOUS) ×3 IMPLANT
BASEPLATE TIBIAL ROTATING SZ 4 (Knees) ×3 IMPLANT
BLADE SAG 18X100X1.27 (BLADE) ×3 IMPLANT
BLADE SAW SGTL 11.0X1.19X90.0M (BLADE) ×3 IMPLANT
BLADE SURG SZ10 CARB STEEL (BLADE) ×6 IMPLANT
BNDG ELASTIC 6X5.8 VLCR STR LF (GAUZE/BANDAGES/DRESSINGS) ×3 IMPLANT
BOWL SMART MIX CTS (DISPOSABLE) ×3 IMPLANT
CEMENT HV SMART SET (Cement) ×6 IMPLANT
CLOSURE WOUND 1/2 X4 (GAUZE/BANDAGES/DRESSINGS) ×2
COVER SURGICAL LIGHT HANDLE (MISCELLANEOUS) ×3 IMPLANT
COVER WAND RF STERILE (DRAPES) IMPLANT
CUFF TOURN SGL QUICK 34 (TOURNIQUET CUFF) ×2
CUFF TRNQT CYL 34X4.125X (TOURNIQUET CUFF) ×1 IMPLANT
DECANTER SPIKE VIAL GLASS SM (MISCELLANEOUS) ×3 IMPLANT
DRAPE U-SHAPE 47X51 STRL (DRAPES) ×3 IMPLANT
DRSG AQUACEL AG ADV 3.5X10 (GAUZE/BANDAGES/DRESSINGS) ×3 IMPLANT
DURAPREP 26ML APPLICATOR (WOUND CARE) ×3 IMPLANT
ELECT REM PT RETURN 15FT ADLT (MISCELLANEOUS) ×3 IMPLANT
GLOVE BIO SURGEON STRL SZ7 (GLOVE) ×3 IMPLANT
GLOVE BIO SURGEON STRL SZ8 (GLOVE) ×3 IMPLANT
GLOVE BIOGEL PI IND STRL 7.0 (GLOVE) ×1 IMPLANT
GLOVE BIOGEL PI IND STRL 8 (GLOVE) ×1 IMPLANT
GLOVE BIOGEL PI INDICATOR 7.0 (GLOVE) ×2
GLOVE BIOGEL PI INDICATOR 8 (GLOVE) ×2
GOWN STRL REUS W/TWL LRG LVL3 (GOWN DISPOSABLE) ×6 IMPLANT
HANDPIECE INTERPULSE COAX TIP (DISPOSABLE) ×2
HOLDER FOLEY CATH W/STRAP (MISCELLANEOUS) IMPLANT
IMMOBILIZER KNEE 20 (SOFTGOODS) ×3
IMMOBILIZER KNEE 20 THIGH 36 (SOFTGOODS) ×1 IMPLANT
KIT TURNOVER KIT A (KITS) IMPLANT
MANIFOLD NEPTUNE II (INSTRUMENTS) ×3 IMPLANT
NS IRRIG 1000ML POUR BTL (IV SOLUTION) ×3 IMPLANT
PACK TOTAL KNEE CUSTOM (KITS) ×3 IMPLANT
PADDING CAST COTTON 6X4 STRL (CAST SUPPLIES) ×3 IMPLANT
PATELLA MEDIAL ATTUN 35MM KNEE (Knees) ×3 IMPLANT
PENCIL SMOKE EVACUATOR (MISCELLANEOUS) ×3 IMPLANT
PIN DRILL FIX HALF THREAD (BIT) ×3 IMPLANT
PIN STEINMAN FIXATION KNEE (PIN) ×3 IMPLANT
PROTECTOR NERVE ULNAR (MISCELLANEOUS) ×3 IMPLANT
SET HNDPC FAN SPRY TIP SCT (DISPOSABLE) ×1 IMPLANT
STRIP CLOSURE SKIN 1/2X4 (GAUZE/BANDAGES/DRESSINGS) ×4 IMPLANT
SUT MNCRL AB 4-0 PS2 18 (SUTURE) ×3 IMPLANT
SUT STRATAFIX 0 PDS 27 VIOLET (SUTURE) ×3
SUT VIC AB 2-0 CT1 27 (SUTURE) ×6
SUT VIC AB 2-0 CT1 TAPERPNT 27 (SUTURE) ×3 IMPLANT
SUTURE STRATFX 0 PDS 27 VIOLET (SUTURE) ×1 IMPLANT
TRAY FOLEY MTR SLVR 16FR STAT (SET/KITS/TRAYS/PACK) ×3 IMPLANT
WATER STERILE IRR 1000ML POUR (IV SOLUTION) ×6 IMPLANT
WRAP KNEE MAXI GEL POST OP (GAUZE/BANDAGES/DRESSINGS) ×3 IMPLANT

## 2020-09-22 NOTE — Anesthesia Procedure Notes (Signed)
Anesthesia Regional Block: Adductor canal block   Pre-Anesthetic Checklist: ,, timeout performed, Correct Patient, Correct Site, Correct Laterality, Correct Procedure, Correct Position, site marked, Risks and benefits discussed,  Surgical consent,  Pre-op evaluation,  At surgeon's request and post-op pain management  Laterality: Left  Prep: chloraprep       Needles:  Injection technique: Single-shot  Needle Type: Echogenic Needle     Needle Length: 9cm      Additional Needles:   Procedures:,,,, ultrasound used (permanent image in chart),,,,  Narrative:  Start time: 09/22/2020 10:46 AM End time: 09/22/2020 10:52 AM Injection made incrementally with aspirations every 5 mL.  Performed by: Personally  Anesthesiologist: Myrtie Soman, MD  Additional Notes: Patient tolerated the procedure well without complications

## 2020-09-22 NOTE — Interval H&P Note (Signed)
History and Physical Interval Note:  09/22/2020 9:32 AM  Jackie Hubbard  has presented today for surgery, with the diagnosis of left knee osteoarthritis.  The various methods of treatment have been discussed with the patient and family. After consideration of risks, benefits and other options for treatment, the patient has consented to  Procedure(s) with comments: TOTAL KNEE ARTHROPLASTY (Left) - 38min as a surgical intervention.  The patient's history has been reviewed, patient examined, no change in status, stable for surgery.  I have reviewed the patient's chart and labs.  Questions were answered to the patient's satisfaction.     Pilar Plate Joanthony Hamza

## 2020-09-22 NOTE — Anesthesia Procedure Notes (Signed)
Spinal  Patient location during procedure: OR Start time: 09/22/2020 11:17 AM End time: 09/22/2020 12:01 PM Staffing Performed: anesthesiologist  Anesthesiologist: Myrtie Soman, MD Preanesthetic Checklist Completed: patient identified, IV checked, site marked, risks and benefits discussed, surgical consent, monitors and equipment checked, pre-op evaluation and timeout performed Spinal Block Patient position: sitting Prep: Betadine Patient monitoring: heart rate, continuous pulse ox and blood pressure Approach: midline Location: L3-4 Injection technique: single-shot Needle Needle type: Sprotte  Needle gauge: 24 G Needle length: 9 cm Assessment Sensory level: T6 Additional Notes Expiration date of kit checked and confirmed. Patient tolerated procedure well, without complications.

## 2020-09-22 NOTE — Transfer of Care (Signed)
Immediate Anesthesia Transfer of Care Note  Patient: Jackie Hubbard  Procedure(s) Performed: TOTAL KNEE ARTHROPLASTY (Left Knee)  Patient Location: PACU  Anesthesia Type:Spinal  Level of Consciousness: awake, alert  and oriented  Airway & Oxygen Therapy: Patient Spontanous Breathing and Patient connected to face mask  Post-op Assessment: Report given to RN and Post -op Vital signs reviewed and stable  Post vital signs: Reviewed and stable  Last Vitals:  Vitals Value Taken Time  BP 118/69 09/22/20 1328  Temp    Pulse 78 09/22/20 1329  Resp 27 09/22/20 1329  SpO2 100 % 09/22/20 1329  Vitals shown include unvalidated device data.  Last Pain:  Vitals:   09/22/20 1054  TempSrc:   PainSc: 0-No pain      Patients Stated Pain Goal: 4 (00/16/42 9037)  Complications: No complications documented.

## 2020-09-22 NOTE — Evaluation (Signed)
Physical Therapy Evaluation Patient Details Name: Jackie Hubbard MRN: [id number] DOB: 07/26/1961 Today's Date: 09/22/2020   History of Present Illness  Patient is 59 y.o. female s/p Lt TKA on 09/22/20 with PMH significant for osteopenia, OA.  Clinical Impression  Grabiela Wohlford is a 59 y.o. female POD 0 s/p Lt TKA. Patient reports independence with mobility at baseline. Patient is now limited by functional impairments (see PT problem list below) and requires min assist for transfers and gait with RW. Patient was able to ambulate ~40 feet with RW and min assist. Patient instructed in exercise to facilitate ROM and circulation. Patient will benefit from continued skilled PT interventions to address impairments and progress towards PLOF. Acute PT will follow to progress mobility and stair training in preparation for safe discharge home.     Follow Up Recommendations Follow surgeon's recommendation for DC plan and follow-up therapies;Outpatient PT    Equipment Recommendations  Rolling walker with 5" wheels;3in1 (PT)    Recommendations for Other Services       Precautions / Restrictions Precautions Precautions: Fall Restrictions Weight Bearing Restrictions: No Other Position/Activity Restrictions: WBAT      Mobility  Bed Mobility Overal bed mobility: Needs Assistance Bed Mobility: Supine to Sit     Supine to sit: Min assist;HOB elevated     General bed mobility comments: light assist for Lt LE, cues to use bed rail to pivot.    Transfers Overall transfer level: Needs assistance Equipment used: Rolling walker (2 wheeled) Transfers: Sit to/from Stand Sit to Stand: Min assist         General transfer comment: cues for technique with RW, light assist for power up from EOB.  Ambulation/Gait Ambulation/Gait assistance: Min assist Gait Distance (Feet): 40 Feet Assistive device: Rolling walker (2 wheeled) Gait Pattern/deviations: Step-to pattern;Decreased stride  length;Decreased weight shift to left Gait velocity: decr   General Gait Details: cues for step pattern and proximity to RW, assist to steady due to slight imbalance from hip weakness secondary to spinal block. no buckling at Lt knee observed.  Stairs            Wheelchair Mobility    Modified Rankin (Stroke Patients Only)       Balance Overall balance assessment: Needs assistance Sitting-balance support: Feet supported Sitting balance-Leahy Scale: Good     Standing balance support: During functional activity;Bilateral upper extremity supported Standing balance-Leahy Scale: Fair                               Pertinent Vitals/Pain Pain Assessment: Faces Faces Pain Scale: Hurts a little bit Pain Location: Lt knee Pain Descriptors / Indicators: Discomfort Pain Intervention(s): Limited activity within patient's tolerance;Monitored during session;Repositioned;Ice applied    Home Living Family/patient expects to be discharged to:: Private residence Living Arrangements: Spouse/significant other Available Help at Discharge: Family Type of Home: House Home Access: Stairs to enter Entrance Stairs-Rails: None Entrance Stairs-Number of Steps: 4 Home Layout: Two level;Bed/bath upstairs;1/2 bath on main level Home Equipment: None      Prior Function Level of Independence: Independent               Hand Dominance   Dominant Hand: Right    Extremity/Trunk Assessment   Upper Extremity Assessment Upper Extremity Assessment: Overall WFL for tasks assessed    Lower Extremity Assessment Lower Extremity Assessment: LLE deficits/detail LLE Deficits / Details: good quad activation, no extensor lag with SLR, slight hip weakness.  LLE Sensation: WNL LLE Coordination: WNL    Cervical / Trunk Assessment Cervical / Trunk Assessment: Normal  Communication   Communication: No difficulties  Cognition Arousal/Alertness: Awake/alert Behavior During Therapy:  WFL for tasks assessed/performed Overall Cognitive Status: Within Functional Limits for tasks assessed                                        General Comments      Exercises Total Joint Exercises Ankle Circles/Pumps: AROM;Both;20 reps;Seated Quad Sets: AROM;Left;5 reps;Seated Heel Slides: AROM;Left;5 reps;Seated   Assessment/Plan    PT Assessment Patient needs continued PT services  PT Problem List Decreased strength;Decreased activity tolerance;Decreased balance;Decreased range of motion;Decreased mobility;Decreased knowledge of use of DME;Decreased knowledge of precautions       PT Treatment Interventions DME instruction;Gait training;Stair training;Functional mobility training;Therapeutic activities;Therapeutic exercise;Balance training;Patient/family education    PT Goals (Current goals can be found in the Care Plan section)  Acute Rehab PT Goals Patient Stated Goal: to regain independence PT Goal Formulation: With patient Time For Goal Achievement: 09/29/20 Potential to Achieve Goals: Good    Frequency 7X/week   Barriers to discharge        Co-evaluation               AM-PAC PT "6 Clicks" Mobility  Outcome Measure Help needed turning from your back to your side while in a flat bed without using bedrails?: None Help needed moving from lying on your back to sitting on the side of a flat bed without using bedrails?: A Little Help needed moving to and from a bed to a chair (including a wheelchair)?: A Little Help needed standing up from a chair using your arms (e.g., wheelchair or bedside chair)?: A Little Help needed to walk in hospital room?: A Little Help needed climbing 3-5 steps with a railing? : A Little 6 Click Score: 19    End of Session Equipment Utilized During Treatment: Gait belt Activity Tolerance: Patient tolerated treatment well Patient left: in chair;with call bell/phone within reach;with chair alarm set;with family/visitor  present Nurse Communication: Mobility status PT Visit Diagnosis: Muscle weakness (generalized) (M62.81);Difficulty in walking, not elsewhere classified (R26.2)    Time: 8329-1916 PT Time Calculation (min) (ACUTE ONLY): 23 min   Charges:   PT Evaluation $PT Eval Low Complexity: 1 Low PT Treatments $Gait Training: 8-22 mins        Verner Mould, DPT Acute Rehabilitation Services  Office [phone] Pager [phone]  09/22/2020 4:44 PM

## 2020-09-22 NOTE — Progress Notes (Signed)
Orthopedic Tech Progress Note Patient Details:  Jackie Hubbard December 21, 1960 [id number]  CPM Left Knee CPM Left Knee: Off Left Knee Flexion (Degrees): 40 Left Knee Extension (Degrees): 10  Post Interventions Patient Tolerated: Well Instructions Provided: Care of device  [place] Pink 09/22/2020, 4:24 PM

## 2020-09-22 NOTE — Anesthesia Preprocedure Evaluation (Signed)
Anesthesia Evaluation  Patient identified by MRN, date of birth, ID band Patient awake    Reviewed: Allergy & Precautions, H&P , NPO status , Patient's Chart, lab work & pertinent test results  Airway Mallampati: II  TM Distance: >3 FB Neck ROM: Full    Dental no notable dental hx.    Pulmonary neg pulmonary ROS, former smoker,    Pulmonary exam normal breath sounds clear to auscultation       Cardiovascular negative cardio ROS Normal cardiovascular exam Rhythm:Regular Rate:Normal     Neuro/Psych negative neurological ROS  negative psych ROS   GI/Hepatic negative GI ROS, Neg liver ROS,   Endo/Other  negative endocrine ROS  Renal/GU negative Renal ROS  negative genitourinary   Musculoskeletal  (+) Arthritis , Osteoarthritis,    Abdominal   Peds negative pediatric ROS (+)  Hematology negative hematology ROS (+)   Anesthesia Other Findings   Reproductive/Obstetrics negative OB ROS                             Anesthesia Physical Anesthesia Plan  ASA: II  Anesthesia Plan: Spinal   Post-op Pain Management:  Regional for Post-op pain   Induction: Intravenous  PONV Risk Score and Plan: 2 and Ondansetron, Dexamethasone and Treatment may vary due to age or medical condition  Airway Management Planned: Simple Face Mask  Additional Equipment:   Intra-op Plan:   Post-operative Plan:   Informed Consent: I have reviewed the patients History and Physical, chart, labs and discussed the procedure including the risks, benefits and alternatives for the proposed anesthesia with the patient or authorized representative who has indicated his/her understanding and acceptance.     Dental advisory given  Plan Discussed with: CRNA and Surgeon  Anesthesia Plan Comments:         Anesthesia Quick Evaluation

## 2020-09-22 NOTE — Plan of Care (Signed)
  Problem: Education: Goal: Knowledge of General Education information will improve Description: Including pain rating scale, medication(s)/side effects and non-pharmacologic comfort measures Outcome: Progressing   Problem: Activity: Goal: Risk for activity intolerance will decrease Outcome: Progressing   Problem: Nutrition: Goal: Adequate nutrition will be maintained Outcome: Progressing   Problem: Elimination: Goal: Will not experience complications related to bowel motility Outcome: Progressing   Problem: Safety: Goal: Ability to remain free from injury will improve Outcome: Progressing   Problem: Education: Goal: Knowledge of the prescribed therapeutic regimen will improve Outcome: Progressing   Problem: Activity: Goal: Ability to avoid complications of mobility impairment will improve Outcome: Progressing   Problem: Pain Management: Goal: Pain level will decrease with appropriate interventions Outcome: Progressing

## 2020-09-22 NOTE — Progress Notes (Signed)
Orthopedic Tech Progress Note Patient Details:  Jackie Hubbard 12-11-1960 [id number] Patient already has knee immobilizer. Patient ID: [name], female   DOB: May 13, 1961, 59 y.o.   MRN: [id number]   [name] 09/22/2020, 3:17 PM

## 2020-09-22 NOTE — Op Note (Signed)
OPERATIVE REPORT-TOTAL KNEE ARTHROPLASTY   Pre-operative diagnosis- Osteoarthritis  Left knee(s)  Post-operative diagnosis- Osteoarthritis Left knee(s)  Procedure-  Left  Total Knee Arthroplasty  Surgeon- Dione Plover. Afsheen Antony, MD  Assistant- Theresa Duty, PA-C   Anesthesia-  Adductor canal block and spinal  EBL-  25 ml   Drains None  Tourniquet time- 34 minutes @ 619 mm Hg  Complications- None  Condition-PACU - hemodynamically stable.   Brief Clinical Note  Jackie Hubbard is a 59 y.o. year old female with end stage OA of her left knee with progressively worsening pain and dysfunction. She has constant pain, with activity and at rest and significant functional deficits with difficulties even with ADLs. She has had extensive non-op management including analgesics, injections of cortisone and viscosupplements, and home exercise program, but remains in significant pain with significant dysfunction. Radiographs show bone on bone arthritis medial and patellofemoral. She presents now for left Total Knee Arthroplasty.    Procedure in detail---   The patient is brought into the operating room and positioned supine on the operating table. After successful administration of  Adductor canal block and spinal,   a tourniquet is placed high on the  Left thigh(s) and the lower extremity is prepped and draped in the usual sterile fashion. Time out is performed by the operating team and then the  Left lower extremity is wrapped in Esmarch, knee flexed and the tourniquet inflated to 300 mmHg.       A midline incision is made with a ten blade through the subcutaneous tissue to the level of the extensor mechanism. A fresh blade is used to make a medial parapatellar arthrotomy. Soft tissue over the proximal medial tibia is subperiosteally elevated to the joint line with a knife and into the semimembranosus bursa with a Cobb elevator. Soft tissue over the proximal lateral tibia is elevated with  attention being paid to avoiding the patellar tendon on the tibial tubercle. The patella is everted, knee flexed 90 degrees and the ACL and PCL are removed. Findings are bone on bone medial and patellofemoral with large global osteophytes.        The drill is used to create a starting hole in the distal femur and the canal is thoroughly irrigated with sterile saline to remove the fatty contents. The 5 degree Left  valgus alignment guide is placed into the femoral canal and the distal femoral cutting block is pinned to remove 9 mm off the distal femur. Resection is made with an oscillating saw.      The tibia is subluxed forward and the menisci are removed. The extramedullary alignment guide is placed referencing proximally at the medial aspect of the tibial tubercle and distally along the second metatarsal axis and tibial crest. The block is pinned to remove 61mm off the more deficient medial  side. Resection is made with an oscillating saw. Size 4is the most appropriate size for the tibia and the proximal tibia is prepared with the modular drill and keel punch for that size.      The femoral sizing guide is placed and size 5 is most appropriate. Rotation is marked off the epicondylar axis and confirmed by creating a rectangular flexion gap at 90 degrees. The size 5 cutting block is pinned in this rotation and the anterior, posterior and chamfer cuts are made with the oscillating saw. The intercondylar block is then placed and that cut is made.      Trial size 4 tibial component, trial size 5  narrow posterior stabilized femur and a 8  mm posterior stabilized rotating platform insert trial is placed. Full extension is achieved with excellent varus/valgus and anterior/posterior balance throughout full range of motion. The patella is everted and thickness measured to be 22  mm. Free hand resection is taken to 12 mm, a 35 template is placed, lug holes are drilled, trial patella is placed, and it tracks normally.  Osteophytes are removed off the posterior femur with the trial in place. All trials are removed and the cut bone surfaces prepared with pulsatile lavage. Cement is mixed and once ready for implantation, the size 4 tibial implant, size  5 narrow posterior stabilized femoral component, and the size 35 patella are cemented in place and the patella is held with the clamp. The trial insert is placed and the knee held in full extension. The Exparel (20 ml mixed with 60 ml saline) is injected into the extensor mechanism, posterior capsule, medial and lateral gutters and subcutaneous tissues.  All extruded cement is removed and once the cement is hard the permanent 10 mm posterior stabilized rotating platform insert is placed into the tibial tray.      The wound is copiously irrigated with saline solution and the extensor mechanism closed with # 0 Stratofix suture. The tourniquet is released for a total tourniquet time of 34  minutes. Flexion against gravity is 140 degrees and the patella tracks normally. Subcutaneous tissue is closed with 2.0 vicryl and subcuticular with running 4.0 Monocryl. The incision is cleaned and dried and steri-strips and a bulky sterile dressing are applied. The limb is placed into a knee immobilizer and the patient is awakened and transported to recovery in stable condition.      Please note that a surgical assistant was a medical necessity for this procedure in order to perform it in a safe and expeditious manner. Surgical assistant was necessary to retract the ligaments and vital neurovascular structures to prevent injury to them and also necessary for proper positioning of the limb to allow for anatomic placement of the prosthesis.   Dione Plover Alby Schwabe, MD    09/22/2020, 1:07 PM

## 2020-09-22 NOTE — Anesthesia Procedure Notes (Signed)
Anesthesia Procedure Image    

## 2020-09-22 NOTE — Anesthesia Postprocedure Evaluation (Signed)
Anesthesia Post Note  Patient: Jackie Hubbard  Procedure(s) Performed: TOTAL KNEE ARTHROPLASTY (Left Knee)     Patient location during evaluation: PACU Anesthesia Type: Spinal Level of consciousness: oriented and awake and alert Pain management: pain level controlled Vital Signs Assessment: post-procedure vital signs reviewed and stable Respiratory status: spontaneous breathing, respiratory function stable and patient connected to nasal cannula oxygen Cardiovascular status: blood pressure returned to baseline and stable Postop Assessment: no headache, no backache and no apparent nausea or vomiting Anesthetic complications: no   No complications documented.  Last Vitals:  Vitals:   09/22/20 1445 09/22/20 1512  BP: 109/67 105/63  Pulse: (!) 52 (!) 47  Resp: 16 18  Temp: 36.8 C (!) 36.3 C  SpO2: 100% 100%    Last Pain:  Vitals:   09/22/20 1512  TempSrc: Oral  PainSc: 0-No pain                 Sun Kihn S

## 2020-09-22 NOTE — Progress Notes (Signed)
Orthopedic Tech Progress Note Patient Details:  Jackie Hubbard [birth year] [id number]  CPM Left Knee CPM Left Knee: On Left Knee Flexion (Degrees): 40 Left Knee Extension (Degrees): 10  Post Interventions Patient Tolerated: Well Instructions Provided: Care of device  [place] Pink 09/22/2020, 1:25 PM

## 2020-09-22 NOTE — Discharge Instructions (Signed)
 Frank Aluisio, MD Total Joint Specialist [facility] [place] [address] [place], [place] [address] [phone]  TOTAL KNEE REPLACEMENT POSTOPERATIVE DIRECTIONS    Knee Rehabilitation, Guidelines Following Surgery  Results after knee surgery are often greatly improved when you follow the exercise, range of motion and muscle strengthening exercises prescribed by your doctor. Safety measures are also important to protect the knee from further injury. If any of these exercises cause you to have increased pain or swelling in your knee joint, decrease the amount until you are comfortable again and slowly increase them. If you have problems or questions, call your caregiver or physical therapist for advice.   BLOOD CLOT PREVENTION . Take a 325 mg Aspirin two times a day for three weeks following surgery. Then take an 81 mg Aspirin once a day for three weeks. Then discontinue Aspirin. . You may resume your vitamins/supplements upon discharge from the hospital. . Do not take any NSAIDs (Advil, Aleve, Ibuprofen, Meloxicam, etc.) until you have discontinued the 325 mg Aspirin.  HOME CARE INSTRUCTIONS  . Remove items at home which could result in a fall. This includes throw rugs or furniture in walking pathways.  . ICE to the affected knee as much as tolerated. Icing helps control swelling. If the swelling is well controlled you will be more comfortable and rehab easier. Continue to use ice on the knee for pain and swelling from surgery. You may notice swelling that will progress down to the foot and ankle. This is normal after surgery. Elevate the leg when you are not up walking on it.    . Continue to use the breathing machine which will help keep your temperature down. It is common for your temperature to cycle up and down following surgery, especially at night when you are not up moving around and exerting yourself. The breathing machine keeps your lungs expanded and your  temperature down. . Do not place pillow under the operative knee, focus on keeping the knee straight while resting  DIET You may resume your previous home diet once you are discharged from the hospital.  DRESSING / WOUND CARE / SHOWERING . Keep your bulky bandage on for 2 days. On the third post-operative day you may remove the Ace bandage and gauze. There is a waterproof adhesive bandage on your skin which will stay in place until your first follow-up appointment. Once you remove this you will not need to place another bandage . You may begin showering 3 days following surgery, but do not submerge the incision under water.  ACTIVITY For the first 5 days, the key is rest and control of pain and swelling . Do your home exercises twice a day starting on post-operative day 3. On the days you go to physical therapy, just do the home exercises once that day. . You should rest, ice and elevate the leg for 50 minutes out of every hour. Get up and walk/stretch for 10 minutes per hour. After 5 days you can increase your activity slowly as tolerated. . Walk with your walker as instructed. Use the walker until you are comfortable transitioning to a cane. Walk with the cane in the opposite hand of the operative leg. You may discontinue the cane once you are comfortable and walking steadily. . Avoid periods of inactivity such as sitting longer than an hour when not asleep. This helps prevent blood clots.  . You may discontinue the knee immobilizer once you are able to perform a straight   leg raise while lying down. . You may resume a sexual relationship in one month or when given the OK by your doctor.  . You may return to work once you are cleared by your doctor.  . Do not drive a car for 6 weeks or until released by your surgeon.  . Do not drive while taking narcotics.  TED HOSE STOCKINGS Wear the elastic stockings on both legs for three weeks following surgery during the day. You may remove them at night  for sleeping.  WEIGHT BEARING Weight bearing as tolerated with assist device (walker, cane, etc) as directed, use it as long as suggested by your surgeon or therapist, typically at least 4-6 weeks.  POSTOPERATIVE CONSTIPATION PROTOCOL Constipation - defined medically as fewer than three stools per week and severe constipation as less than one stool per week.  One of the most common issues patients have following surgery is constipation.  Even if you have a regular bowel pattern at home, your normal regimen is likely to be disrupted due to multiple reasons following surgery.  Combination of anesthesia, postoperative narcotics, change in appetite and fluid intake all can affect your bowels.  In order to avoid complications following surgery, here are some recommendations in order to help you during your recovery period.  . Colace (docusate) - Pick up an over-the-counter form of Colace or another stool softener and take twice a day as long as you are requiring postoperative pain medications.  Take with a full glass of water daily.  If you experience loose stools or diarrhea, hold the colace until you stool forms back up. If your symptoms do not get better within 1 week or if they get worse, check with your doctor. . Dulcolax (bisacodyl) - Pick up over-the-counter and take as directed by the product packaging as needed to assist with the movement of your bowels.  Take with a full glass of water.  Use this product as needed if not relieved by Colace only.  . MiraLax (polyethylene glycol) - Pick up over-the-counter to have on hand. MiraLax is a solution that will increase the amount of water in your bowels to assist with bowel movements.  Take as directed and can mix with a glass of water, juice, soda, coffee, or tea. Take if you go more than two days without a movement. Do not use MiraLax more than once per day. Call your doctor if you are still constipated or irregular after using this medication for 7 days  in a row.  If you continue to have problems with postoperative constipation, please contact the office for further assistance and recommendations.  If you experience "the worst abdominal pain ever" or develop nausea or vomiting, please contact the office immediatly for further recommendations for treatment.  ITCHING If you experience itching with your medications, try taking only a single pain pill, or even half a pain pill at a time.  You can also use Benadryl over the counter for itching or also to help with sleep.   MEDICATIONS See your medication summary on the "After Visit Summary" that the nursing staff will review with you prior to discharge.  You may have some home medications which will be placed on hold until you complete the course of blood thinner medication.  It is important for you to complete the blood thinner medication as prescribed by your surgeon.  Continue your approved medications as instructed at time of discharge.  PRECAUTIONS . If you experience chest pain or shortness of   breath - call 911 immediately for transfer to the hospital emergency department.  . If you develop a fever greater that 101 F, purulent drainage from wound, increased redness or drainage from wound, foul odor from the wound/dressing, or calf pain - CONTACT YOUR SURGEON.                                                   FOLLOW-UP APPOINTMENTS Make sure you keep all of your appointments after your operation with your surgeon and caregivers. You should call the office at the above phone number and make an appointment for approximately two weeks after the date of your surgery or on the date instructed by your surgeon outlined in the "After Visit Summary".  RANGE OF MOTION AND STRENGTHENING EXERCISES  Rehabilitation of the knee is important following a knee injury or an operation. After just a few days of immobilization, the muscles of the thigh which control the knee become weakened and shrink (atrophy). Knee  exercises are designed to build up the tone and strength of the thigh muscles and to improve knee motion. Often times heat used for twenty to thirty minutes before working out will loosen up your tissues and help with improving the range of motion but do not use heat for the first two weeks following surgery. These exercises can be done on a training (exercise) mat, on the floor, on a table or on a bed. Use what ever works the best and is most comfortable for you Knee exercises include:  . Leg Lifts - While your knee is still immobilized in a splint or cast, you can do straight leg raises. Lift the leg to 60 degrees, hold for 3 sec, and slowly lower the leg. Repeat 10-20 times 2-3 times daily. Perform this exercise against resistance later as your knee gets better.  . Quad and Hamstring Sets - Tighten up the muscle on the front of the thigh (Quad) and hold for 5-10 sec. Repeat this 10-20 times hourly. Hamstring sets are done by pushing the foot backward against an object and holding for 5-10 sec. Repeat as with quad sets.   Leg Slides: Lying on your back, slowly slide your foot toward your buttocks, bending your knee up off the floor (only go as far as is comfortable). Then slowly slide your foot back down until your leg is flat on the floor again.  Angel Wings: Lying on your back spread your legs to the side as far apart as you can without causing discomfort.  A rehabilitation program following serious knee injuries can speed recovery and prevent re-injury in the future due to weakened muscles. Contact your doctor or a physical therapist for more information on knee rehabilitation.   IF YOU ARE TRANSFERRED TO A SKILLED REHAB FACILITY If the patient is transferred to a skilled rehab facility following release from the hospital, a list of the current medications will be sent to the facility for the patient to continue.  When discharged from the skilled rehab facility, please have the facility set up the  patient's Home Health Physical Therapy prior to being released. Also, the skilled facility will be responsible for providing the patient with their medications at time of release from the facility to include their pain medication, the muscle relaxants, and their blood thinner medication. If the patient is still at the   rehab facility at time of the two week follow up appointment, the skilled rehab facility will also need to assist the patient in arranging follow up appointment in our office and any transportation needs.  MAKE SURE YOU:  . Understand these instructions.  . Get help right away if you are not doing well or get worse.   DENTAL ANTIBIOTICS:  In most cases prophylactic antibiotics for Dental procdeures after total joint surgery are not necessary.  Exceptions are as follows:  1. History of prior total joint infection  2. Severely immunocompromised (Organ Transplant, cancer chemotherapy, Rheumatoid biologic meds such as Humera)  3. Poorly controlled diabetes (A1C &gt; 8.0, blood glucose over 200)  If you have one of these conditions, contact your surgeon for an antibiotic prescription, prior to your dental procedure.    Pick up stool softner and laxative for home use following surgery while on pain medications. Do not submerge incision under water. Please use good hand washing techniques while changing dressing each day. May shower starting three days after surgery. Please use a clean towel to pat the incision dry following showers. Continue to use ice for pain and swelling after surgery. Do not use any lotions or creams on the incision until instructed by your surgeon.  

## 2020-09-22 NOTE — Progress Notes (Signed)
Assisted Dr. Rose with left, ultrasound guided, adductor canal block. Side rails up, monitors on throughout procedure. See vital signs in flow sheet. Tolerated Procedure well.  

## 2020-09-23 ENCOUNTER — Encounter (HOSPITAL_COMMUNITY): Payer: Self-pay | Admitting: Orthopedic Surgery

## 2020-09-23 DIAGNOSIS — M1712 Unilateral primary osteoarthritis, left knee: Secondary | ICD-10-CM | POA: Diagnosis not present

## 2020-09-23 LAB — BASIC METABOLIC PANEL
Anion gap: 10 (ref 5–15)
BUN: 13 mg/dL (ref 8–23)
CO2: 22 mmol/L (ref 22–32)
Calcium: 8.9 mg/dL (ref 8.9–10.3)
Chloride: 104 mmol/L (ref 98–111)
Creatinine, Ser: 0.62 mg/dL (ref 0.44–1.00)
GFR, Estimated: >60 mL/min (ref >60)
Glucose, Bld: 127 mg/dL — ABNORMAL HIGH (ref 70–99)
Potassium: 4.4 mmol/L (ref 3.5–5.1)
Sodium: 136 mmol/L (ref 135–145)

## 2020-09-23 LAB — CBC
HCT: 34.9 % — ABNORMAL LOW (ref 36.0–46.0)
Hemoglobin: 11.5 g/dL — ABNORMAL LOW (ref 12.0–15.0)
MCH: 30.7 pg (ref 26.0–34.0)
MCHC: 33 g/dL (ref 30.0–36.0)
MCV: 93.1 fL (ref 80.0–100.0)
Platelets: 222 10*3/uL (ref 150–400)
RBC: 3.75 MIL/uL — ABNORMAL LOW (ref 3.87–5.11)
RDW: 12.1 % (ref 11.5–15.5)
WBC: 11.8 10*3/uL — ABNORMAL HIGH (ref 4.0–10.5)
nRBC: 0 % (ref 0.0–0.2)

## 2020-09-23 MED ORDER — METHOCARBAMOL 500 MG PO TABS: 500.0000 mg | ORAL_TABLET | Freq: Four times a day (QID) | ORAL | 0 refills | Status: DC | PRN

## 2020-09-23 MED ORDER — OXYCODONE HCL 5 MG PO TABS: 5.0000 mg | ORAL_TABLET | Freq: Four times a day (QID) | ORAL | 0 refills | Status: DC | PRN

## 2020-09-23 MED ORDER — ASPIRIN 325 MG PO TBEC: 325.0000 mg | DELAYED_RELEASE_TABLET | Freq: Two times a day (BID) | ORAL | 0 refills | Status: AC

## 2020-09-23 MED ORDER — GABAPENTIN 300 MG PO CAPS: ORAL_CAPSULE | ORAL | 0 refills | Status: DC

## 2020-09-23 MED ORDER — TRAMADOL HCL 50 MG PO TABS: 50.0000 mg | ORAL_TABLET | Freq: Four times a day (QID) | ORAL | 0 refills | Status: DC | PRN

## 2020-09-23 NOTE — Progress Notes (Signed)
   Subjective: 1 Day Post-Op Procedure(s) (LRB): TOTAL KNEE ARTHROPLASTY (Left) Patient reports pain as mild.   Patient seen in rounds by Dr. Wynelle Link. Patient is well, and has had no acute complaints or problems other than pain in the left knee. Denies chest pain, SOB, or calf pain. No issues overnight, states she is ready to go home. Foley catheter removed this AM. We will continue therapy today, ambulated 40' yesterday.  Objective: Vital signs in last 24 hours: Temp:  [97.3 F (36.3 C)-98.4 F (36.9 C)] 98.2 F (36.8 C) (12/14 0514) Pulse Rate:  [47-78] 54 (12/14 0514) Resp:  [12-27] 15 (12/14 0514) BP: (99-124)/(60-80) 110/70 (12/14 0514) SpO2:  [94 %-100 %] 96 % (12/14 0514) Weight:  [62.6 kg] 62.6 kg (12/13 1512)  Intake/Output from previous day:  Intake/Output Summary (Last 24 hours) at 09/23/2020 0740 Last data filed at 09/23/2020 0514 Gross per 24 hour  Intake 3177.5 ml  Output 2350 ml  Net 827.5 ml     Intake/Output this shift: No intake/output data recorded.  Labs: Recent Labs    09/23/20 0310  HGB 11.5*   Recent Labs    09/23/20 0310  WBC 11.8*  RBC 3.75*  HCT 34.9*  PLT 222   Recent Labs    09/23/20 0310  NA 136  K 4.4  CL 104  CO2 22  BUN 13  CREATININE 0.62  GLUCOSE 127*  CALCIUM 8.9   No results for input(s): LABPT, INR in the last 72 hours.  Exam: General - Patient is Alert and Oriented Extremity - Neurologically intact Neurovascular intact Sensation intact distally Dorsiflexion/Plantar flexion intact Dressing - dressing C/D/I Motor Function - intact, moving foot and toes well on exam.   Past Medical History:  Diagnosis Date  . Arthritis    knees  . History of shingles   . LGSIL (low grade squamous intraepithelial dysplasia) 2009  . Osteopenia 12/2013   T score -1.2 FRAX 5.7%/0.3%    Assessment/Plan: 1 Day Post-Op Procedure(s) (LRB): TOTAL KNEE ARTHROPLASTY (Left) Principal Problem:   OA (osteoarthritis) of  knee Active Problems:   Primary osteoarthritis of left knee  Estimated body mass index is 23.69 kg/m as calculated from the following:   Height as of this encounter: 5\' 4"  (1.626 m).   Weight as of this encounter: 62.6 kg. Advance diet Up with therapy D/C IV fluids   Patient's anticipated LOS is less than 2 midnights, meeting these requirements: - Younger than 60 - Lives within 1 hour of care - Has a competent adult at home to recover with post-op recover - NO history of  - Chronic pain requiring opioids  - Diabetes  - Coronary Artery Disease  - Heart failure  - Heart attack  - Stroke  - DVT/VTE  - Cardiac arrhythmia  - Respiratory Failure/COPD  - Renal failure  - Anemia  - Advanced Liver disease  DVT Prophylaxis - Aspirin Weight bearing as tolerated. Continue therapy.  Plan is to go Home after hospital stay. Plan for discharge after one session of PT if cleared. Scheduled for outpatient physical therapy at [facility]. Follow-up in the office in 2 weeks.  The PDMP database was reviewed today prior to any opioid medications being prescribed to this patient.  Theresa Duty, PA-C Orthopedic Surgery [phone] 09/23/2020, 7:40 AM

## 2020-09-23 NOTE — Plan of Care (Signed)
Plan of care reviewed and discussed with the patient. 

## 2020-09-23 NOTE — TOC Transition Note (Signed)
Transition of Care [facility]) - CM/SW Discharge Note   Patient Details  Name: Jackie Hubbard MRN: [id number] Date of Birth: [birth year]  Transition of Care [facility]) CM/SW Contact:  Lia Hopping, LCSW Phone Number: 09/23/2020, 10:11 AM   Clinical Narrative:    Therapy Plan: OPPT@EO  [facility] delivered a RW and 3 in 1 to the patient bedside.    Final next level of care: OP Rehab Barriers to Discharge: Barriers Resolved   Patient Goals and CMS Choice       Discharge Placement                      Discharge Plan and Services                DME Arranged: 3-N-1,Walker rolling DME Agency: [employer] Date DME Agency Contacted: 09/23/20 Time DME Agency Contacted: [id number] Representative spoke with at DME Agency: [place] ([place] Interventions     Readmission Risk Interventions No flowsheet data found.

## 2020-09-23 NOTE — Progress Notes (Signed)
Orthopedic Tech Progress Note Patient Details:  Jackie Hubbard 1961/05/30 [id number]  Patient ID: Jackie Hubbard, female   DOB: Jul 16, 1961, 59 y.o.   MRN: [id number]   Jackie Hubbard 09/23/2020, 8:02 AMPickup CPM

## 2020-09-23 NOTE — Plan of Care (Signed)
  Problem: Education: Goal: Knowledge of General Education information will improve Description: Including pain rating scale, medication(s)/side effects and non-pharmacologic comfort measures Outcome: Adequate for Discharge   Problem: Health Behavior/Discharge Planning: Goal: Ability to manage health-related needs will improve Outcome: Adequate for Discharge   Problem: Clinical Measurements: Goal: Ability to maintain clinical measurements within normal limits will improve Outcome: Adequate for Discharge Goal: Will remain free from infection Outcome: Adequate for Discharge Goal: Diagnostic test results will improve Outcome: Adequate for Discharge Goal: Respiratory complications will improve Outcome: Adequate for Discharge   Problem: Activity: Goal: Risk for activity intolerance will decrease Outcome: Adequate for Discharge   Problem: Elimination: Goal: Will not experience complications related to bowel motility Outcome: Adequate for Discharge   Problem: Pain Managment: Goal: General experience of comfort will improve Outcome: Adequate for Discharge   Problem: Safety: Goal: Ability to remain free from injury will improve Outcome: Adequate for Discharge   Problem: Skin Integrity: Goal: Risk for impaired skin integrity will decrease Outcome: Adequate for Discharge   Problem: Education: Goal: Knowledge of the prescribed therapeutic regimen will improve Outcome: Adequate for Discharge   Problem: Activity: Goal: Ability to avoid complications of mobility impairment will improve Outcome: Adequate for Discharge Goal: Range of joint motion will improve Outcome: Adequate for Discharge   Problem: Clinical Measurements: Goal: Postoperative complications will be avoided or minimized Outcome: Adequate for Discharge   Problem: Pain Management: Goal: Pain level will decrease with appropriate interventions Outcome: Adequate for Discharge   Problem: Acute Rehab PT Goals(only PT  should resolve) Goal: Pt Will Go Supine/Side To Sit Outcome: Adequate for Discharge Goal: Patient Will Transfer Sit To/From Stand Outcome: Adequate for Discharge Goal: Pt Will Ambulate Outcome: Adequate for Discharge Goal: Pt Will Go Up/Down Stairs Outcome: Adequate for Discharge

## 2020-09-23 NOTE — Progress Notes (Signed)
Physical Therapy Treatment Patient Details Name: Jackie Hubbard MRN: [id number] DOB: [birth year] Today's Date: 09/23/2020    History of Present Illness Patient is 59 y.o. female s/p Lt TKA on 09/22/20 with PMH significant for osteopenia, OA.    PT Comments    Progressing well with mobility. Practiced/reviewed exercises, gait training, and stair training. Instructed pt to ambulate often at home. Also recommended she perform some (10 reps of each) SLRs and seated knee flexion exercises on tomorrow. She is set to begin OP PT on Thursday.  All education completed. Okay to d/c from PT standpoint.    Follow Up Recommendations  Follow surgeon's recommendation for DC plan and follow-up therapies (plan is for OPPT)     Equipment Recommendations       Recommendations for Other Services       Precautions / Restrictions Precautions Precautions: Fall Restrictions Weight Bearing Restrictions: No Other Position/Activity Restrictions: WBAT    Mobility  Bed Mobility Overal bed mobility: Modified Independent                Transfers Overall transfer level: Needs assistance Equipment used: Rolling walker (2 wheeled) Transfers: Sit to/from Stand Sit to Stand: Supervision         General transfer comment: VCs safety, technique, hand/LE placement.  Ambulation/Gait Ambulation/Gait assistance: Supervision Gait Distance (Feet): 75 Feet Assistive device: Rolling walker (2 wheeled) Gait Pattern/deviations: Step-to pattern     General Gait Details: supervision for safety. cues for sequence, RW proximity.   Stairs Stairs: Yes Stairs assistance: Min assist Stair Management: Step to pattern Number of Stairs: 4 General stair comments: Practiced x 1 with pt using RW backwards and husband stabilizing RW. Practiced x 1 with 1 handrail on R to simulate going upstairs to bedroom. Cues for safety, technique, sequence.   Wheelchair Mobility    Modified Rankin (Stroke Patients  Only)       Balance Overall balance assessment: Needs assistance         Standing balance support: Bilateral upper extremity supported Standing balance-Leahy Scale: Fair                              Cognition Arousal/Alertness: Awake/alert Behavior During Therapy: WFL for tasks assessed/performed Overall Cognitive Status: Within Functional Limits for tasks assessed                                        Exercises      General Comments        Pertinent Vitals/Pain Pain Assessment: 0-10 Pain Score: 2  Pain Location: L knee Pain Descriptors / Indicators: Discomfort;Sore Pain Intervention(s): Monitored during session    Home Living                      Prior Function            PT Goals (current goals can now be found in the care plan section) Progress towards PT goals: Progressing toward goals    Frequency    7X/week      PT Plan Current plan remains appropriate    Co-evaluation              AM-PAC PT "6 Clicks" Mobility   Outcome Measure  Help needed turning from your back to your side while in a flat bed without using bedrails?: None Help  needed moving from lying on your back to sitting on the side of a flat bed without using bedrails?: None Help needed moving to and from a bed to a chair (including a wheelchair)?: A Little Help needed standing up from a chair using your arms (e.g., wheelchair or bedside chair)?: A Little Help needed to walk in hospital room?: A Little Help needed climbing 3-5 steps with a railing? : A Little 6 Click Score: 20    End of Session Equipment Utilized During Treatment: Gait belt Activity Tolerance: Patient tolerated treatment well Patient left: in chair;with call bell/phone within reach;with family/visitor present   PT Visit Diagnosis: Other abnormalities of gait and mobility (R26.89)     Time: 1022-1050 PT Time Calculation (min) (ACUTE ONLY): 28 min  Charges:  $Gait  Training: 8-22 mins $Therapeutic Exercise: 8-22 mins                        Doreatha Massed, PT Acute Rehabilitation  Office: [phone] Pager: [phone]

## 2020-10-01 NOTE — Discharge Summary (Signed)
Physician Discharge Summary   Patient ID: Jackie Hubbard MRN: [id number] DOB/AGE: Feb 11, 1961 59 y.o.  Admit date: 09/22/2020 Discharge date: 09/23/2020  Primary Diagnosis: Osteoarthritis, left knee   Admission Diagnoses:  Past Medical History:  Diagnosis Date  . Arthritis    knees  . History of shingles   . LGSIL (low grade squamous intraepithelial dysplasia) 2009  . Osteopenia 12/2013   T score -1.2 FRAX 5.7%/0.3%   Discharge Diagnoses:   Principal Problem:   OA (osteoarthritis) of knee Active Problems:   Primary osteoarthritis of left knee  Estimated body mass index is 23.69 kg/m as calculated from the following:   Height as of this encounter: 5\' 4"  (1.626 m).   Weight as of this encounter: 62.6 kg.  Procedure:  Procedure(s) (LRB): TOTAL KNEE ARTHROPLASTY (Left)   Consults: None  HPI: Jackie Hubbard is a 59 y.o. year old female with end stage OA of her left knee with progressively worsening pain and dysfunction. She has constant pain, with activity and at rest and significant functional deficits with difficulties even with ADLs. She has had extensive non-op management including analgesics, injections of cortisone and viscosupplements, and home exercise program, but remains in significant pain with significant dysfunction. Radiographs show bone on bone arthritis medial and patellofemoral. She presents now for left Total Knee Arthroplasty.   Laboratory Data: Admission on 09/22/2020, Discharged on 09/23/2020  Component Date Value Ref Range Status  . ABO/RH(D) 09/22/2020    Final                   Value:O POS Performed at [facility], [place] [address], [place], [place] [address]   . WBC 09/23/2020 11.8* 4.0 - 10.5 K/uL Final  . RBC 09/23/2020 3.75* 3.87 - 5.11 MIL/uL Final  . Hemoglobin 09/23/2020 11.5* 12.0 - 15.0 g/dL Final  . HCT 09/23/2020 34.9* 36.0 - 46.0 % Final  . MCV 09/23/2020 93.1  80.0 - 100.0 fL Final  . MCH 09/23/2020 30.7  26.0  - 34.0 pg Final  . MCHC 09/23/2020 33.0  30.0 - 36.0 g/dL Final  . RDW 09/23/2020 12.1  11.5 - 15.5 % Final  . Platelets 09/23/2020 222  150 - 400 K/uL Final  . nRBC 09/23/2020 0.0  0.0 - 0.2 % Final   Performed at [facility], [place] [address] [place], [place] [address]  . Sodium 09/23/2020 136  135 - 145 mmol/L Final  . Potassium 09/23/2020 4.4  3.5 - 5.1 mmol/L Final  . Chloride 09/23/2020 104  98 - 111 mmol/L Final  . CO2 09/23/2020 22  22 - 32 mmol/L Final  . Glucose, Bld 09/23/2020 127* 70 - 99 mg/dL Final   Glucose reference range applies only to samples taken after fasting for at least 8 hours.  . BUN 09/23/2020 13  8 - 23 mg/dL Final  . Creatinine, Ser 09/23/2020 0.62  0.44 - 1.00 mg/dL Final  . Calcium 09/23/2020 8.9  8.9 - 10.3 mg/dL Final  . GFR, Estimated 09/23/2020 >60  >60 mL/min Final   Comment: (NOTE) Calculated using the CKD-EPI Creatinine Equation (2021)   . Anion gap 09/23/2020 10  5 - 15 Final   Performed at [facility], [place] [address], [place], [place] [address]  Hospital Outpatient Visit on 09/18/2020  Component Date Value Ref Range Status  . SARS Coronavirus 2 09/18/2020 NEGATIVE  NEGATIVE Final   Comment: (NOTE) SARS-CoV-2 target nucleic acids are NOT DETECTED.  The SARS-CoV-2 RNA is  generally detectable in upper and lower respiratory specimens during the acute phase of infection. Negative results do not preclude SARS-CoV-2 infection, do not rule out co-infections with other pathogens, and should not be used as the sole basis for treatment or other patient management decisions. Negative results must be combined with clinical observations, patient history, and epidemiological information. The expected result is Negative.  Fact Sheet for Patients: HairSlick.no  Fact Sheet for Healthcare Providers: quierodirigir.com  This test is not yet approved or cleared by  the [place] FDA and  has been authorized for detection and/or diagnosis of SARS-CoV-2 by FDA under an Emergency Use Authorization (EUA). This EUA will remain  in effect (meaning this test can be used) for the duration of the COVID-19 declaration under Se                          ction 564(b)(1) of the Act, 21 U.S.C. section 360bbb-3(b)(1), unless the authorization is terminated or revoked sooner.  Performed at [facility], [address], [place], [place] [address]   Hospital Outpatient Visit on 09/18/2020  Component Date Value Ref Range Status  . WBC 09/18/2020 8.9  4.0 - 10.5 K/uL Final  . RBC 09/18/2020 4.62  3.87 - 5.11 MIL/uL Final  . Hemoglobin 09/18/2020 14.4  12.0 - 15.0 g/dL Final  . HCT 62/37/6283 42.4  36.0 - 46.0 % Final  . MCV 09/18/2020 91.8  80.0 - 100.0 fL Final  . MCH 09/18/2020 31.2  26.0 - 34.0 pg Final  . MCHC 09/18/2020 34.0  30.0 - 36.0 g/dL Final  . RDW 15/17/6160 12.4  11.5 - 15.5 % Final  . Platelets 09/18/2020 259  150 - 400 K/uL Final  . nRBC 09/18/2020 0.0  0.0 - 0.2 % Final   Performed at [facility], [address], [place], [place] [address]  . Sodium 09/18/2020 141  135 - 145 mmol/L Final  . Potassium 09/18/2020 4.6  3.5 - 5.1 mmol/L Final  . Chloride 09/18/2020 101  98 - 111 mmol/L Final  . CO2 09/18/2020 28  22 - 32 mmol/L Final  . Glucose, Bld 09/18/2020 109* 70 - 99 mg/dL Final   Glucose reference range applies only to samples taken after fasting for at least 8 hours.  . BUN 09/18/2020 19  8 - 23 mg/dL Final  . Creatinine, Ser 09/18/2020 0.71  0.44 - 1.00 mg/dL Final  . Calcium 62/69/4854 9.8  8.9 - 10.3 mg/dL Final  . Total Protein 09/18/2020 7.5  6.5 - 8.1 g/dL Final  . Albumin 62/70/3500 4.4  3.5 - 5.0 g/dL Final  . AST 93/81/8299 27  15 - 41 U/L Final  . ALT 09/18/2020 26  0 - 44 U/L Final  . Alkaline Phosphatase 09/18/2020 67  38 - 126 U/L Final  . Total Bilirubin 09/18/2020 0.7  0.3 - 1.2 mg/dL Final  .  GFR, Estimated 09/18/2020 >60  >60 mL/min Final   Comment: (NOTE) Calculated using the CKD-EPI Creatinine Equation (2021)   . Anion gap 09/18/2020 12  5 - 15 Final   Performed at [facility], [address], [place], [place] [address]  . Prothrombin Time 09/18/2020 12.3  11.4 - 15.2 seconds Final  . INR 09/18/2020 1.0  0.8 - 1.2 Final   Comment: (NOTE) INR goal varies based on device and disease states. Performed at [facility], [address] [place], [place] [address]   .  aPTT 09/18/2020 28  24 - 36 seconds Final   Performed at [facility], [address] [place], [place] [address]  . ABO/RH(D) 09/18/2020 O POS   Final  . Antibody Screen 09/18/2020 NEG   Final  . Sample Expiration 09/18/2020 09/25/2020,2359   Final  . Extend sample reason 09/18/2020    Final                   Value:NO TRANSFUSIONS OR PREGNANCY IN THE PAST 3 MONTHS Performed at [facility], [address], [place], [place] [address]   . MRSA, PCR 09/18/2020 NEGATIVE  NEGATIVE Final  . Staphylococcus aureus 09/18/2020 NEGATIVE  NEGATIVE Final   Comment: (NOTE) The Xpert SA Assay (FDA approved for NASAL specimens in patients 53 years of age and older), is one component of a comprehensive surveillance program. It is not intended to diagnose infection nor to guide or monitor treatment. Performed at [facility], [address] [place], [place] [address]      X-Rays:No results found.  EKG: Orders placed or performed in visit on 04/25/12  . EKG 12-Lead     Hospital Course: Jackie Hubbard is a 59 y.o. who was admitted to [facility]. They were brought to the operating room on 09/22/2020 and underwent Procedure(s): TOTAL KNEE ARTHROPLASTY.  Patient tolerated the procedure well and was later transferred to the recovery room and then to the orthopaedic floor for postoperative care. They were given PO and  IV analgesics for pain control following their surgery. They were given 24 hours of postoperative antibiotics of  Anti-infectives (From admission, onward)   Start     Dose/Rate Route Frequency Ordered Stop   09/22/20 1800  ceFAZolin (ANCEF) IVPB 2g/100 mL premix        2 g 200 mL/hr over 30 Minutes Intravenous Every 6 hours 09/22/20 1516 09/23/20 0009   09/22/20 0915  ceFAZolin (ANCEF) IVPB 2g/100 mL premix        2 g 200 mL/hr over 30 Minutes Intravenous On call to O.R. 09/22/20 5916 09/22/20 1153     and started on DVT prophylaxis in the form of Aspirin.   PT and OT were ordered for total joint protocol. Discharge planning consulted to help with postop disposition and equipment needs.  Patient had a good night on the evening of surgery. They started to get up OOB with therapy on POD #0. Pt was seen during rounds and was ready to go home pending progress with therapy. She worked with therapy on POD #1 and was meeting her goals. Pt was discharged to home later that day in stable condition.  Diet: Regular diet Activity: WBAT Follow-up: in 2 weeks Disposition: Home with outpatient physical therapy Discharged Condition: stable   Discharge Instructions    Call MD / Call 911   Complete by: As directed    If you experience chest pain or shortness of breath, CALL 911 and be transported to the hospital emergency room.  If you develope a fever above 101 F, pus (white drainage) or increased drainage or redness at the wound, or calf pain, call your surgeon's office.   Change dressing   Complete by: As directed    You may remove the bulky bandage (ACE wrap and gauze) two days after surgery. You will have an adhesive waterproof bandage underneath. Leave this in place until your first follow-up appointment.   Constipation Prevention   Complete by: As directed  Drink plenty of fluids.  Prune juice may be helpful.  You may use a stool softener, such as Colace (over the counter) 100 mg twice a day.  Use  MiraLax (over the counter) for constipation as needed.   Diet - low sodium heart healthy   Complete by: As directed    Do not put a pillow under the knee. Place it under the heel.   Complete by: As directed    Driving restrictions   Complete by: As directed    No driving for two weeks   TED hose   Complete by: As directed    Use stockings (TED hose) for three weeks on both leg(s).  You may remove them at night for sleeping.   Weight bearing as tolerated   Complete by: As directed      Allergies as of 09/23/2020      Reactions   Penicillins Other (See Comments)   Reaction as a child Tolerated Cephalosporin Date: 09/23/20.      Medication List    STOP taking these medications   ibuprofen 200 MG tablet Commonly known as: ADVIL     TAKE these medications   acetaminophen 325 MG tablet Commonly known as: TYLENOL Take 650 mg by mouth every 6 (six) hours as needed for moderate pain.   aspirin 325 MG EC tablet Take 1 tablet (325 mg total) by mouth 2 (two) times daily for 20 days. Then take one 81 mg aspirin once a day for three weeks. Then discontinue aspirin.   gabapentin 300 MG capsule Commonly known as: NEURONTIN Take a 300 mg capsule three times a day for two weeks following surgery.Then take a 300 mg capsule two times a day for two weeks. Then take a 300 mg capsule once a day for two weeks. Then discontinue.   methocarbamol 500 MG tablet Commonly known as: ROBAXIN Take 1 tablet (500 mg total) by mouth every 6 (six) hours as needed for muscle spasms.   multivitamin with minerals tablet Take 1 tablet by mouth daily.   oxyCODONE 5 MG immediate release tablet Commonly known as: Oxy IR/ROXICODONE Take 1-2 tablets (5-10 mg total) by mouth every 6 (six) hours as needed for severe pain.   traMADol 50 MG tablet Commonly known as: ULTRAM Take 1-2 tablets (50-100 mg total) by mouth every 6 (six) hours as needed for moderate pain.            Discharge Care Instructions   (From admission, onward)         Start     Ordered   09/23/20 0000  Weight bearing as tolerated        09/23/20 0744   09/23/20 0000  Change dressing       Comments: You may remove the bulky bandage (ACE wrap and gauze) two days after surgery. You will have an adhesive waterproof bandage underneath. Leave this in place until your first follow-up appointment.   09/23/20 0744          Follow-up Information    Gaynelle Arabian, MD. Schedule an appointment as soon as possible for a visit in 2 week(s).   Specialty: Orthopedic Surgery Contact information: [address] [place] [address] [phone]               Signed: Theresa Duty, PA-C Orthopedic Surgery 10/01/2020, 2:02 PM

## 2021-01-28 ENCOUNTER — Encounter: Payer: 59 | Admitting: Obstetrics and Gynecology

## 2021-01-29 ENCOUNTER — Ambulatory Visit: Payer: 59 | Admitting: Nurse Practitioner

## 2021-02-02 ENCOUNTER — Encounter: Payer: Self-pay | Admitting: Nurse Practitioner

## 2021-02-02 ENCOUNTER — Ambulatory Visit (INDEPENDENT_AMBULATORY_CARE_PROVIDER_SITE_OTHER): Payer: 59 | Admitting: Nurse Practitioner

## 2021-02-02 ENCOUNTER — Other Ambulatory Visit: Payer: Self-pay

## 2021-02-02 VITALS — BP 118/78 | Ht 62.0 in | Wt 138.0 lb

## 2021-02-02 DIAGNOSIS — Z01419 Encounter for gynecological examination (general) (routine) without abnormal findings: Secondary | ICD-10-CM

## 2021-02-02 DIAGNOSIS — Z780 Asymptomatic menopausal state: Secondary | ICD-10-CM

## 2021-02-02 DIAGNOSIS — M85852 Other specified disorders of bone density and structure, left thigh: Secondary | ICD-10-CM | POA: Diagnosis not present

## 2021-02-02 NOTE — Patient Instructions (Signed)

## 2021-02-02 NOTE — Progress Notes (Signed)
   Jackie Hubbard 1960-10-20 [id number]   History:  60 y.o. G3P3 presents for annual exam without GYN complaints. Postmenopausal - no HRT, no bleeding. 2009 LGSIL, subsequent paps normal. Osteopenia.   Gynecologic History No LMP recorded. Patient is postmenopausal.   Contraception/Family planning: post menopausal status  Health Maintenance Last Pap: 01/25/2019. Results were: normal Last mammogram: 05/29/2020. Results were: normal Last colonoscopy: 2010 Last Dexa: 2015. Results were: T-score -1.2  Past medical history, past surgical history, family history and social history were all reviewed and documented in the EPIC chart. Married. Works in Oceanographer. 2 daughters - one at [place], one in [place] teaching rowing.   ROS:  A ROS was performed and pertinent positives and negatives are included.  Exam:  Vitals:   02/02/21 1059  BP: 118/78  Weight: 138 lb (62.6 kg)  Height: 5\' 2"  (1.575 m)   Body mass index is 25.24 kg/m.  General appearance:  Normal Thyroid:  Symmetrical, normal in size, without palpable masses or nodularity. Respiratory  Auscultation:  Clear without wheezing or rhonchi Cardiovascular  Auscultation:  Regular rate, without rubs, murmurs or gallops  Edema/varicosities:  Not grossly evident Abdominal  Soft,nontender, without masses, guarding or rebound.  Liver/spleen:  No organomegaly noted  Hernia:  None appreciated  Skin  Inspection:  Grossly normal Breasts: Examined lying and sitting. Bilateral breast implants noted.   Right: Without masses, retractions, nipple discharge or axillary adenopathy.   Left: Without masses, retractions, nipple discharge or axillary adenopathy. Gentitourinary   Inguinal/mons:  Normal without inguinal adenopathy  External genitalia:  Normal appearing vulva with no masses, tenderness, or lesions  BUS/Urethra/Skene's glands:  Normal  Vagina:  Normal appearing with normal color and discharge, no lesions. Atrophic  changes.   Cervix:  Normal appearing without discharge or lesions  Uterus:  Normal in size, shape and contour.  Midline and mobile, nontender  Adnexa/parametria:     Rt: Normal in size, without masses or tenderness.   Lt: Normal in size, without masses or tenderness.  Anus and perineum: Normal  Digital rectal exam: Normal sphincter tone without palpated masses or tenderness  Assessment/Plan:  60 y.o. G3P3 for annual exam.   Well female exam with routine gynecological exam - Education provided on SBEs, importance of preventative screenings, current guidelines, high calcium diet, regular exercise, and multivitamin daily. Labs with PCP.   Osteopenia of left hip - Plan: DG Bone Density. 2013 T-score -1.2 at spine. Recommend repeating bone density. Daily vitamin D supplement and regular exercise. 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   Postmenopausal - Plan: DG Bone Density. No HRT, no bleeding.   Screening for cervical cancer - Normal Pap history.  Will repeat at 3-year interval per guidelines.  Screening for breast cancer - Normal mammogram history.  Continue annual screenings.  Normal breast exam today.  Screening for colon cancer - 2010 colonoscopy. She is overdue and plans to schedule this soon.   Return in 1 year for annual.    Jackie Gammon DNP, 11:13 AM 02/02/2021

## 2021-02-06 ENCOUNTER — Encounter: Payer: Self-pay | Admitting: Internal Medicine

## 2021-02-17 ENCOUNTER — Other Ambulatory Visit: Payer: Self-pay | Admitting: Nurse Practitioner

## 2021-02-17 ENCOUNTER — Other Ambulatory Visit: Payer: Self-pay

## 2021-02-17 ENCOUNTER — Ambulatory Visit (INDEPENDENT_AMBULATORY_CARE_PROVIDER_SITE_OTHER): Payer: 59

## 2021-02-17 DIAGNOSIS — M85852 Other specified disorders of bone density and structure, left thigh: Secondary | ICD-10-CM

## 2021-02-17 DIAGNOSIS — Z780 Asymptomatic menopausal state: Secondary | ICD-10-CM | POA: Diagnosis not present

## 2021-02-17 DIAGNOSIS — Z13820 Encounter for screening for osteoporosis: Secondary | ICD-10-CM

## 2021-02-17 DIAGNOSIS — M8589 Other specified disorders of bone density and structure, multiple sites: Secondary | ICD-10-CM

## 2021-03-24 ENCOUNTER — Other Ambulatory Visit: Payer: Self-pay

## 2021-03-25 ENCOUNTER — Encounter: Payer: Self-pay | Admitting: Family Medicine

## 2021-03-25 ENCOUNTER — Ambulatory Visit (INDEPENDENT_AMBULATORY_CARE_PROVIDER_SITE_OTHER): Payer: 59 | Admitting: Family Medicine

## 2021-03-25 VITALS — BP 110/62 | HR 57 | Temp 98.1°F | Ht 62.0 in | Wt 135.2 lb

## 2021-03-25 DIAGNOSIS — Z0000 Encounter for general adult medical examination without abnormal findings: Secondary | ICD-10-CM

## 2021-03-25 LAB — LIPID PANEL
Cholesterol: 216 mg/dL — ABNORMAL HIGH (ref 0–200)
HDL: 82.5 mg/dL (ref >39.00)
LDL Cholesterol: 123 mg/dL — ABNORMAL HIGH (ref 0–99)
NonHDL: 133.33
Total CHOL/HDL Ratio: 3
Triglycerides: 53 mg/dL (ref 0.0–149.0)
VLDL: 10.6 mg/dL (ref 0.0–40.0)

## 2021-03-25 LAB — HEPATIC FUNCTION PANEL
ALT: 14 U/L (ref 0–35)
AST: 19 U/L (ref 0–37)
Albumin: 4.5 g/dL (ref 3.5–5.2)
Alkaline Phosphatase: 71 U/L (ref 39–117)
Bilirubin, Direct: 0.1 mg/dL (ref 0.0–0.3)
Total Bilirubin: 0.5 mg/dL (ref 0.2–1.2)
Total Protein: 6.9 g/dL (ref 6.0–8.3)

## 2021-03-25 LAB — BASIC METABOLIC PANEL
BUN: 24 mg/dL — ABNORMAL HIGH (ref 6–23)
CO2: 27 mEq/L (ref 19–32)
Calcium: 9.6 mg/dL (ref 8.4–10.5)
Chloride: 103 mEq/L (ref 96–112)
Creatinine, Ser: 0.9 mg/dL (ref 0.40–1.20)
GFR: 68.66 mL/min (ref >60.00)
Glucose, Bld: 82 mg/dL (ref 70–99)
Potassium: 4.4 mEq/L (ref 3.5–5.1)
Sodium: 138 mEq/L (ref 135–145)

## 2021-03-25 LAB — CBC WITH DIFFERENTIAL/PLATELET
Basophils Absolute: 0.1 10*3/uL (ref 0.0–0.1)
Basophils Relative: 1.7 % (ref 0.0–3.0)
Eosinophils Absolute: 0.1 10*3/uL (ref 0.0–0.7)
Eosinophils Relative: 2.3 % (ref 0.0–5.0)
HCT: 40 % (ref 36.0–46.0)
Hemoglobin: 13.7 g/dL (ref 12.0–15.0)
Lymphocytes Relative: 38.9 % (ref 12.0–46.0)
Lymphs Abs: 2 10*3/uL (ref 0.7–4.0)
MCHC: 34.3 g/dL (ref 30.0–36.0)
MCV: 90.2 fl (ref 78.0–100.0)
Monocytes Absolute: 0.5 10*3/uL (ref 0.1–1.0)
Monocytes Relative: 9.6 % (ref 3.0–12.0)
Neutro Abs: 2.5 10*3/uL (ref 1.4–7.7)
Neutrophils Relative %: 47.5 % (ref 43.0–77.0)
Platelets: 222 10*3/uL (ref 150.0–400.0)
RBC: 4.43 Mil/uL (ref 3.87–5.11)
RDW: 13.4 % (ref 11.5–15.5)
WBC: 5.2 10*3/uL (ref 4.0–10.5)

## 2021-03-25 LAB — TSH: TSH: 1.3 u[IU]/mL (ref 0.35–4.50)

## 2021-03-25 NOTE — Progress Notes (Addendum)
Established Patient Office Visit  Subjective:  Patient ID: Jackie Hubbard, female    DOB: Jun 18, 1961  Age: 60 y.o. MRN: [id number]  CC:  Chief Complaint  Patient presents with   Annual Exam    No new concerns     HPI Jackie Hubbard presents for physical exam.  She had left total knee replacement back in December and has recovered well from that.  She is exercising regularly.  Generally doing well.  She has repeat colonoscopy scheduled for later this summer.  Health maintenance reviewed:  -Colonoscopy due as above -No prior history of hepatitis C screening -Gets yearly flu vaccines -Pap smear is up-to-date -Last mammogram 8/21.  She gets these through gynecology -Tetanus due 2031  Social history-she is currently married.  Second marriage.  Has 2 children.  Non-smoker.  No regular alcohol.  Family history-mother died of some type of leukemia.  She is not sure which type.  Father's age 68.  He has some arthritis issues but otherwise and fairly good health.  She has a sister with hypothyroidism.  No family history of premature CAD or type 2 diabetes.  The 10-year ASCVD risk score Mikey Bussing DC Brooke Bonito., et al., 2013) is: 2.5%   Values used to calculate the score:     Age: 17 years     Sex: Female     Is Non-Hispanic African American: No     Diabetic: No     Tobacco smoker: No     Systolic Blood Pressure: 149 mmHg     Is BP treated: No     HDL Cholesterol: 82.5 mg/dL     Total Cholesterol: 216 mg/dL   Past Medical History:  Diagnosis Date   Arthritis    knees   History of shingles    LGSIL (low grade squamous intraepithelial dysplasia) 2009   Osteopenia 12/2013   T score -1.2 FRAX 5.7%/0.3%    Past Surgical History:  Procedure Laterality Date   AUGMENTATION MAMMAPLASTY Bilateral    BLADDER SUSPENSION     COLONOSCOPY  04/29/09   [facility]   ROTATOR CUFF REPAIR     TOTAL KNEE ARTHROPLASTY Left 09/22/2020   Procedure: TOTAL KNEE ARTHROPLASTY;  Surgeon: Gaynelle Arabian, MD;   Location: [facility];  Service: Orthopedics;  Laterality: Left;    Family History  Problem Relation Age of Onset   Leukemia Mother    Arthritis Father    Hypothyroidism Sister    Breast cancer Neg Hx     Social History   Socioeconomic History   Marital status: Married    Spouse name: Not on file   Number of children: Not on file   Years of education: Not on file   Highest education level: Not on file  Occupational History   Not on file  Tobacco Use   Smoking status: Former    Packs/day: 1.00    Years: 10.00    Pack years: 10.00    Types: Cigarettes    Quit date: 24    Years since quitting: 27.4   Smokeless tobacco: Never  Vaping Use   Vaping Use: Never used  Substance and Sexual Activity   Alcohol use: Not Currently   Drug use: No   Sexual activity: Yes    Birth control/protection: Post-menopausal    Comment: 1st intercourse 59 yo-More than 5 partners  Other Topics Concern   Not on file  Social History Narrative   Not on file   Social Determinants of Radio broadcast assistant  Strain: Not on file  Food Insecurity: Not on file  Transportation Needs: Not on file  Physical Activity: Not on file  Stress: Not on file  Social Connections: Not on file  Intimate Partner Violence: Not on file    Outpatient Medications Prior to Visit  Medication Sig Dispense Refill   acetaminophen (TYLENOL) 325 MG tablet Take 650 mg by mouth every 6 (six) hours as needed for moderate pain.     methocarbamol (ROBAXIN) 500 MG tablet Take 1 tablet (500 mg total) by mouth every 6 (six) hours as needed for muscle spasms. 40 tablet 0   Multiple Vitamins-Minerals (MULTIVITAMIN WITH MINERALS) tablet Take 1 tablet by mouth daily.     No facility-administered medications prior to visit.    Allergies  Allergen Reactions   Penicillins Other (See Comments)    Reaction as a child  Tolerated Cephalosporin Date: 09/23/20.      ROS Review of Systems  Constitutional:  Negative for  activity change, appetite change, fatigue, fever and unexpected weight change.  HENT:  Negative for ear pain, hearing loss, sore throat and trouble swallowing.   Eyes:  Negative for visual disturbance.  Respiratory:  Negative for cough and shortness of breath.   Cardiovascular:  Negative for chest pain and palpitations.  Gastrointestinal:  Negative for abdominal pain, blood in stool, constipation and diarrhea.  Endocrine: Negative for polydipsia and polyuria.  Genitourinary:  Negative for dysuria and hematuria.  Musculoskeletal:  Negative for arthralgias, back pain and myalgias.  Skin:  Negative for rash.  Neurological:  Negative for dizziness, syncope and headaches.  Hematological:  Negative for adenopathy.  Psychiatric/Behavioral:  Negative for confusion and dysphoric mood.      Objective:    Physical Exam Constitutional:      Appearance: She is well-developed.  HENT:     Head: Normocephalic and atraumatic.  Eyes:     Pupils: Pupils are equal, round, and reactive to light.  Neck:     Thyroid: No thyromegaly.  Cardiovascular:     Rate and Rhythm: Normal rate and regular rhythm.     Heart sounds: Normal heart sounds. No murmur heard. Pulmonary:     Effort: No respiratory distress.     Breath sounds: Normal breath sounds. No wheezing or rales.  Abdominal:     General: Bowel sounds are normal. There is no distension.     Palpations: Abdomen is soft. There is no mass.     Tenderness: There is no abdominal tenderness. There is no guarding or rebound.  Musculoskeletal:        General: Normal range of motion.     Cervical back: Normal range of motion and neck supple.     Right lower leg: No edema.     Left lower leg: No edema.  Lymphadenopathy:     Cervical: No cervical adenopathy.  Skin:    Findings: No rash.  Neurological:     Mental Status: She is alert and oriented to person, place, and time.     Cranial Nerves: No cranial nerve deficit.     Deep Tendon Reflexes:  Reflexes normal.  Psychiatric:        Behavior: Behavior normal.        Thought Content: Thought content normal.        Judgment: Judgment normal.    BP 110/62 (BP Location: Left Arm, Patient Position: Sitting, Cuff Size: Normal)   Pulse (!) 57   Temp 98.1 F (36.7 C) (Oral)   Ht 5'  2" (1.575 m)   Wt 135 lb 3.2 oz (61.3 kg)   SpO2 96%   BMI 24.73 kg/m  Wt Readings from Last 3 Encounters:  03/25/21 135 lb 3.2 oz (61.3 kg)  02/02/21 138 lb (62.6 kg)  09/22/20 138 lb 0.1 oz (62.6 kg)     Health Maintenance Due  Topic Date Due   HIV Screening  Never done   Hepatitis C Screening  Never done   COLONOSCOPY (Pts 45-15yrs Insurance coverage will need to be confirmed)  04/30/2019   COVID-19 Vaccine (4 - Booster for [place] series) 12/01/2020    There are no preventive care reminders to display for this patient.  Lab Results  Component Value Date   TSH 1.45 10/29/2015   Lab Results  Component Value Date   WBC 11.8 (H) 09/23/2020   HGB 11.5 (L) 09/23/2020   HCT 34.9 (L) 09/23/2020   MCV 93.1 09/23/2020   PLT 222 09/23/2020   Lab Results  Component Value Date   NA 136 09/23/2020   K 4.4 09/23/2020   CO2 22 09/23/2020   GLUCOSE 127 (H) 09/23/2020   BUN 13 09/23/2020   CREATININE 0.62 09/23/2020   BILITOT 0.7 09/18/2020   ALKPHOS 67 09/18/2020   AST 27 09/18/2020   ALT 26 09/18/2020   PROT 7.5 09/18/2020   ALBUMIN 4.4 09/18/2020   CALCIUM 8.9 09/23/2020   ANIONGAP 10 09/23/2020   GFR 78.69 07/04/2019   Lab Results  Component Value Date   CHOL 225 (H) 10/29/2015   Lab Results  Component Value Date   HDL 91.90 10/29/2015   Lab Results  Component Value Date   LDLCALC 121 (H) 10/29/2015   Lab Results  Component Value Date   TRIG 58.0 10/29/2015   Lab Results  Component Value Date   CHOLHDL 2 10/29/2015   No results found for: HGBA1C    Assessment & Plan:   Problem List Items Addressed This Visit   None Visit Diagnoses     Physical exam    -   Primary   Relevant Orders   Basic metabolic panel   Lipid panel   CBC with Differential/Platelet   TSH   Hepatic function panel   Hep C Antibody     Healthy 60 year old female.  She sees gynecologist regularly for Pap smears and mammograms.  We discussed the following health maintenance issues  -Continue regular strength and aerobic exercise -Continue annual flu vaccine -Check labs including hepatitis C antibody -Other vaccines up-to-date.  We will need pneumonia vaccine at age 63 -Repeat colonoscopy scheduled  No orders of the defined types were placed in this encounter.   Follow-up: No follow-ups on file.    Carolann Littler, MD

## 2021-03-26 LAB — HEPATITIS C ANTIBODY
Hepatitis C Ab: NONREACTIVE
SIGNAL TO CUT-OFF: 0 (ref <1.00)

## 2021-04-15 ENCOUNTER — Other Ambulatory Visit: Payer: Self-pay | Admitting: Family Medicine

## 2021-04-15 DIAGNOSIS — Z1231 Encounter for screening mammogram for malignant neoplasm of breast: Secondary | ICD-10-CM

## 2021-04-16 ENCOUNTER — Other Ambulatory Visit: Payer: Self-pay

## 2021-04-16 ENCOUNTER — Ambulatory Visit (AMBULATORY_SURGERY_CENTER): Payer: 59

## 2021-04-16 VITALS — Ht 62.0 in | Wt 133.0 lb

## 2021-04-16 DIAGNOSIS — Z1211 Encounter for screening for malignant neoplasm of colon: Secondary | ICD-10-CM

## 2021-04-16 MED ORDER — PEG-KCL-NACL-NASULF-NA ASC-C 100 G PO SOLR: 1.0000 | Freq: Once | ORAL | 0 refills | Status: AC

## 2021-04-16 NOTE — Progress Notes (Signed)
Pre visit completed via phone call; patient verified name, DOB, and address; No egg or soy allergy known to patient  No issues with past sedation with any surgeries or procedures Patient denies ever being told they had issues or difficulty with intubation  No FH of Malignant Hyperthermia No diet pills per patient No home 02 use per patient  No blood thinners per patient  Pt denies issues with constipation at this time; No A fib or A flutter  EMMI video via [place] 19 guidelines implemented in PV today with Pt and RN   NO PA's for preps discussed with pt in PV today  Discussed with pt there will be an out-of-pocket cost for prep and that varies from $0 to 70 dollars   Due to the COVID-19 pandemic we are asking patients to follow certain guidelines.  Pt aware of COVID protocols and [facility] guidelines

## 2021-04-30 ENCOUNTER — Encounter: Payer: Self-pay | Admitting: Internal Medicine

## 2021-04-30 ENCOUNTER — Ambulatory Visit (AMBULATORY_SURGERY_CENTER): Payer: 59 | Admitting: Internal Medicine

## 2021-04-30 ENCOUNTER — Other Ambulatory Visit: Payer: Self-pay

## 2021-04-30 VITALS — BP 93/58 | HR 53 | Temp 98.0°F | Resp 14 | Ht 62.0 in | Wt 133.0 lb

## 2021-04-30 DIAGNOSIS — Z1211 Encounter for screening for malignant neoplasm of colon: Secondary | ICD-10-CM | POA: Diagnosis present

## 2021-04-30 MED ORDER — SODIUM CHLORIDE 0.9 % IV SOLN: 500.0000 mL | Freq: Once | INTRAVENOUS | Status: DC

## 2021-04-30 NOTE — Op Note (Signed)
[facility] Patient Name: Jackie Hubbard Procedure Date: 04/30/2021 2:22 PM MRN: [id number] Endoscopist: Docia Chuck. Henrene Pastor , MD Age: 60 Referring MD:  Date of Birth: 07-25-1961 Gender: Female Account #: [id number] Procedure:                Colonoscopy Indications:              Screening for colorectal malignant neoplasm.                            Negative index exam 2010 Medicines:                Monitored Anesthesia Care Procedure:                Pre-Anesthesia Assessment:                           - Prior to the procedure, a History and Physical                            was performed, and patient medications and                            allergies were reviewed. The patient's tolerance of                            previous anesthesia was also reviewed. The risks                            and benefits of the procedure and the sedation                            options and risks were discussed with the patient.                            All questions were answered, and informed consent                            was obtained. Prior Anticoagulants: The patient has                            taken no previous anticoagulant or antiplatelet                            agents. ASA Grade Assessment: I - A normal, healthy                            patient. After reviewing the risks and benefits,                            the patient was deemed in satisfactory condition to                            undergo the procedure.  After obtaining informed consent, the colonoscope                            was passed under direct vision. Throughout the                            procedure, the patient's blood pressure, pulse, and                            oxygen saturations were monitored continuously. The                            Olympus CF-HQ190L ([id number]) Colonoscope was                            introduced through the anus and advanced to the the                             cecum, identified by appendiceal orifice and                            ileocecal valve. The ileocecal valve, appendiceal                            orifice, and rectum were photographed. The quality                            of the bowel preparation was excellent. The                            colonoscopy was performed without difficulty. The                            patient tolerated the procedure well. The bowel                            preparation used was MoviPrep via split dose                            instruction. Scope In: 2:39:49 PM Scope Out: 2:57:23 PM Scope Withdrawal Time: 0 hours 14 minutes 7 seconds  Total Procedure Duration: 0 hours 17 minutes 34 seconds  Findings:                 Multiple diverticula were found in the left colon                            and right colon.                           The exam was otherwise without abnormality on                            direct and retroflexion views. Complications:            No immediate  complications. Estimated blood loss:                            None. Estimated Blood Loss:     Estimated blood loss: none. Impression:               - Diverticulosis in the left colon and in the right                            colon.                           - The examination was otherwise normal on direct                            and retroflexion views.                           - No specimens collected. Recommendation:           - Repeat colonoscopy in 10 years for screening                            purposes.                           - Patient has a contact number available for                            emergencies. The signs and symptoms of potential                            delayed complications were discussed with the                            patient. Return to normal activities tomorrow.                            Written discharge instructions were provided to the                             patient.                           - Resume previous diet.                           - Continue present medications. Docia Chuck. Henrene Pastor, MD 04/30/2021 3:02:21 PM This report has been signed electronically.

## 2021-04-30 NOTE — Progress Notes (Signed)
pt tolerated well. VSS. awake and to recovery. Report given to RN.  

## 2021-04-30 NOTE — Patient Instructions (Signed)
Handout on diverticulosis given.   YOU HAD AN ENDOSCOPIC PROCEDURE TODAY AT THE [facility]:   Refer to the procedure report that was given to you for any specific questions about what was found during the examination.  If the procedure report does not answer your questions, please call your gastroenterologist to clarify.  If you requested that your care partner not be given the details of your procedure findings, then the procedure report has been included in a sealed envelope for you to review at your convenience later.  YOU SHOULD EXPECT: Some feelings of bloating in the abdomen. Passage of more gas than usual.  Walking can help get rid of the air that was put into your GI tract during the procedure and reduce the bloating. If you had a lower endoscopy (such as a colonoscopy or flexible sigmoidoscopy) you may notice spotting of blood in your stool or on the toilet paper. If you underwent a bowel prep for your procedure, you may not have a normal bowel movement for a few days.  Please Note:  You might notice some irritation and congestion in your nose or some drainage.  This is from the oxygen used during your procedure.  There is no need for concern and it should clear up in a day or so.  SYMPTOMS TO REPORT IMMEDIATELY:   Following lower endoscopy (colonoscopy or flexible sigmoidoscopy):  Excessive amounts of blood in the stool  Significant tenderness or worsening of abdominal pains  Swelling of the abdomen that is new, acute  Fever of 100F or higher   For urgent or emergent issues, a gastroenterologist can be reached at any hour by calling [phone]. Do not use MyChart messaging for urgent concerns.    DIET:  We do recommend a small meal at first, but then you may proceed to your regular diet.  Drink plenty of fluids but you should avoid alcoholic beverages for 24 hours.  ACTIVITY:  You should plan to take it easy for the rest of today and you should NOT DRIVE or use  heavy machinery until tomorrow (because of the sedation medicines used during the test).    FOLLOW UP: Our staff will call the number listed on your records 48-72 hours following your procedure to check on you and address any questions or concerns that you may have regarding the information given to you following your procedure. If we do not reach you, we will leave a message.  We will attempt to reach you two times.  During this call, we will ask if you have developed any symptoms of COVID 19. If you develop any symptoms (ie: fever, flu-like symptoms, shortness of breath, cough etc.) before then, please call [phone].  If you test positive for Covid 19 in the 2 weeks post procedure, please call and report this information to us.    If any biopsies were taken you will be contacted by phone or by letter within the next 1-3 weeks.  Please call us at [phone] if you have not heard about the biopsies in 3 weeks.    SIGNATURES/CONFIDENTIALITY: You and/or your care partner have signed paperwork which will be entered into your electronic medical record.  These signatures attest to the fact that that the information above on your After Visit Summary has been reviewed and is understood.  Full responsibility of the confidentiality of this discharge information lies with you and/or your care-partner. 

## 2021-04-30 NOTE — Progress Notes (Signed)
Medical history reviewed with no changes noted. VS assessed by C.W 

## 2021-05-04 ENCOUNTER — Telehealth: Payer: Self-pay | Admitting: *Deleted

## 2021-05-04 ENCOUNTER — Telehealth: Payer: Self-pay

## 2021-05-04 NOTE — Telephone Encounter (Signed)
  Follow up Call-  Call back number 04/30/2021  Post procedure Call Back phone  # [phone]  Permission to leave phone message Yes  Some recent data might be hidden     Patient questions:  Do you have a fever, pain , or abdominal swelling? No. Pain Score  0 *  Have you tolerated food without any problems? Yes.    Have you been able to return to your normal activities? Yes.    Do you have any questions about your discharge instructions: Diet   No. Medications  No. Follow up visit  No.  Do you have questions or concerns about your Care? No.  Actions: * If pain score is 4 or above: No action needed, pain <4.  Have you developed a fever since your procedure? no  2.   Have you had an respiratory symptoms (SOB or cough) since your procedure? no  3.   Have you tested positive for COVID 19 since your procedure no  4.   Have you had any family members/close contacts diagnosed with the COVID 19 since your procedure?  no   If yes to any of these questions please route to Joylene John, RN and Joella Prince, RN

## 2021-05-04 NOTE — Telephone Encounter (Signed)
Attempted f/u phone call. No answer. Left message. °

## 2021-05-05 IMAGING — CT CT ABD-PELV W/ CM
2 of 5 series · 16 of 46 positions shown, 18 images · IV contrast (iopamidol)
Comparison: None.

CLINICAL DATA: Abdominal pain and nausea for several days. Former
smoker.

EXAM:
CT ABDOMEN AND PELVIS WITH CONTRAST
TECHNIQUE: Multidetector CT imaging of the abdomen and pelvis was performed
using the standard protocol following bolus administration of
intravenous contrast.
CONTRAST:  100mL 0RMLOW-2RR IOPAMIDOL (0RMLOW-2RR) INJECTION 61%

[Series 2: abd pelvis 5.00 br40 s3 axial · axial · 0.71mm/px · z∈[+1261,+1631]mm · 13 of 84 slices shown, 15 images]
[im 5/84  soft-tissue]
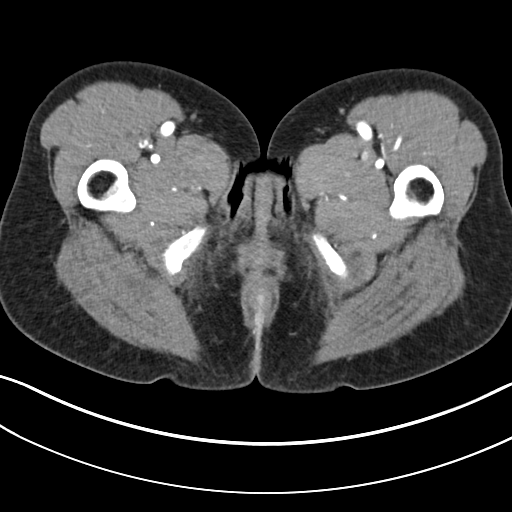
[im 5/84  bone]
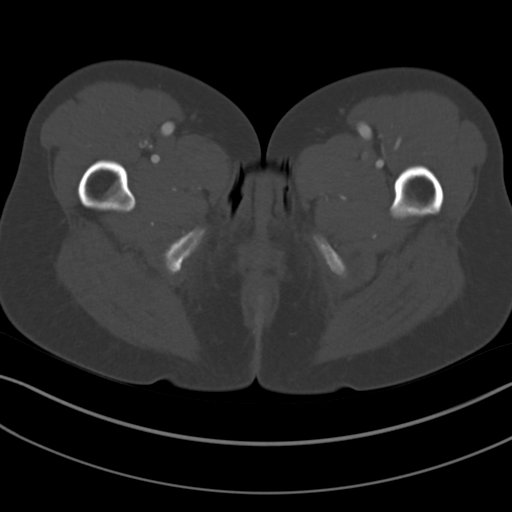
[im 14/84  soft-tissue]
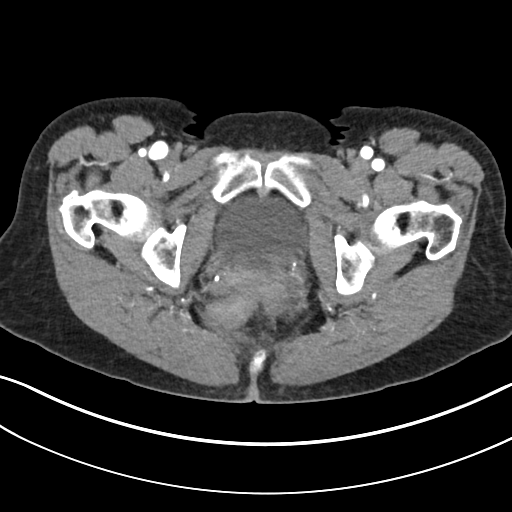
[im 18/84  soft-tissue]
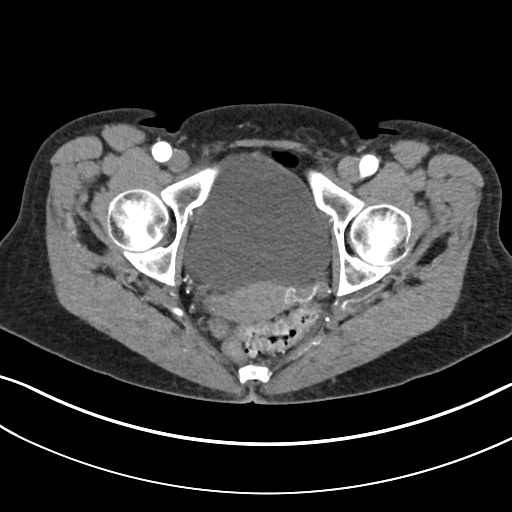
[im 22/84  soft-tissue]
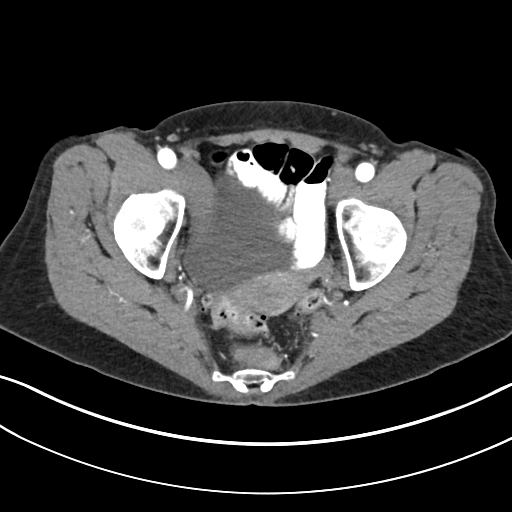
[im 31/84  soft-tissue]
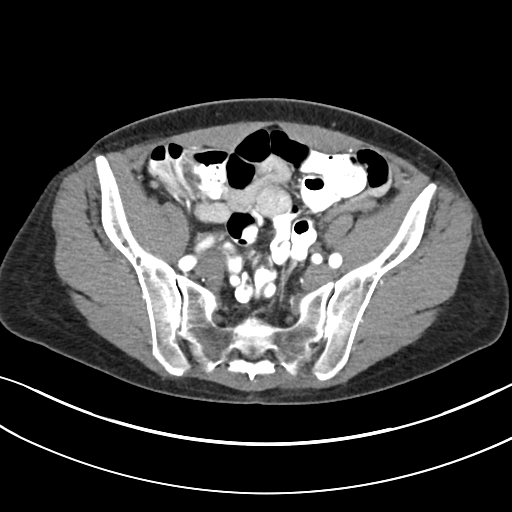
[im 35/84  soft-tissue]
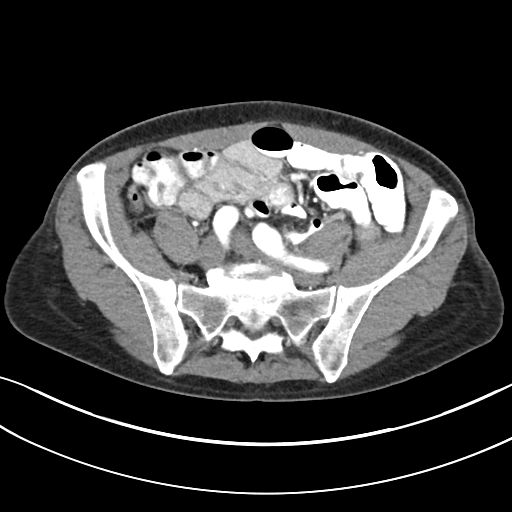
[im 44/84  soft-tissue]
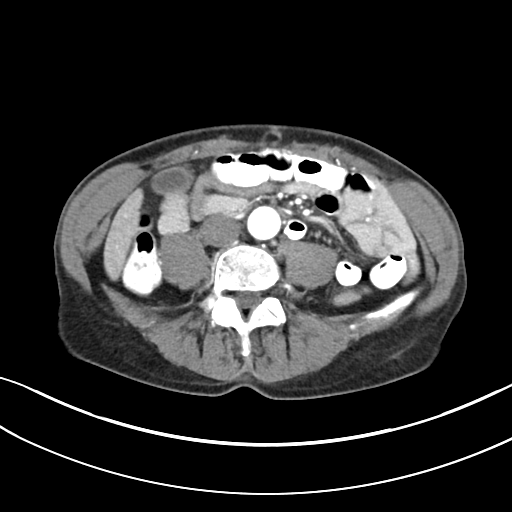
[im 49/84  soft-tissue]
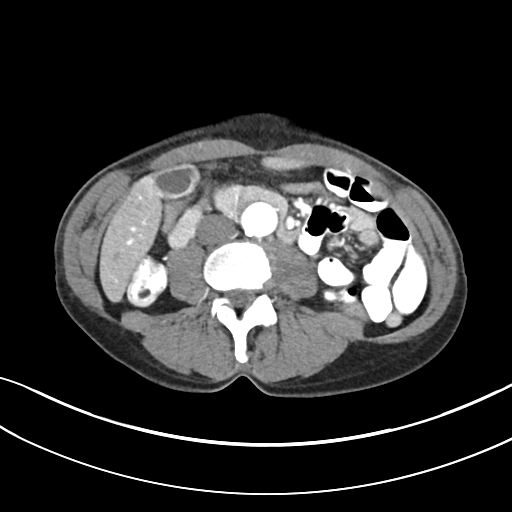
[im 53/84  soft-tissue]
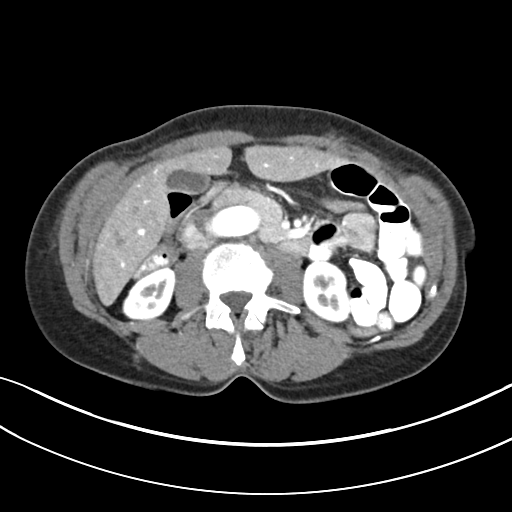
[im 53/84  bone]
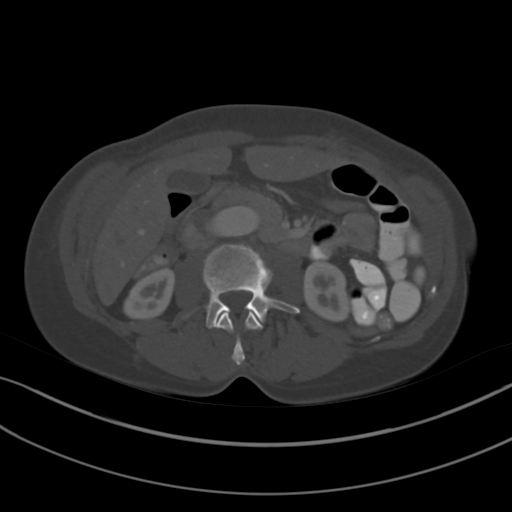
[im 62/84  soft-tissue]
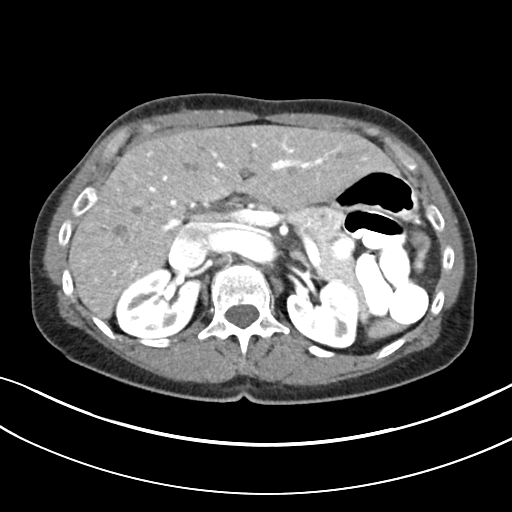
[im 66/84  soft-tissue]
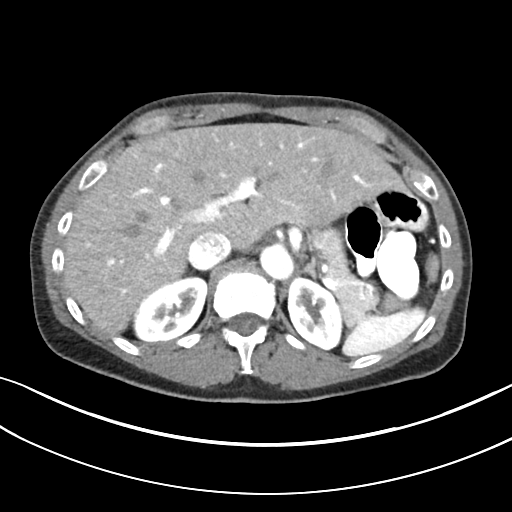
[im 70/84  soft-tissue]
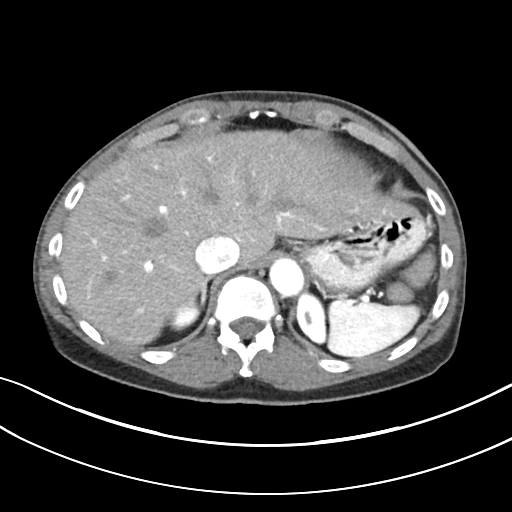
[im 79/84  soft-tissue]
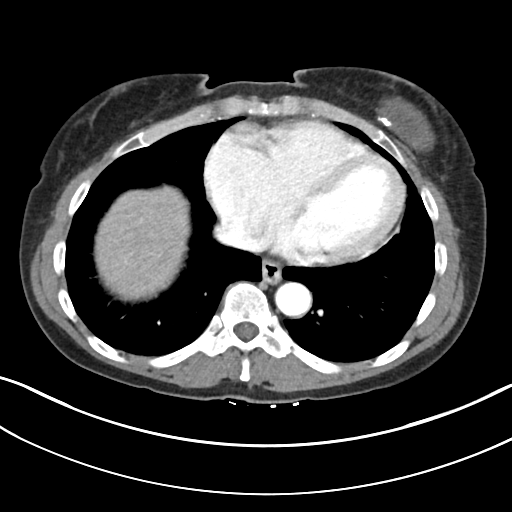

[Series 6: abd pelvis 2.00 br40 s3 cor · coronal · 0.71mm/px · 3 of 129 slices shown]
[im 43/129  soft-tissue]
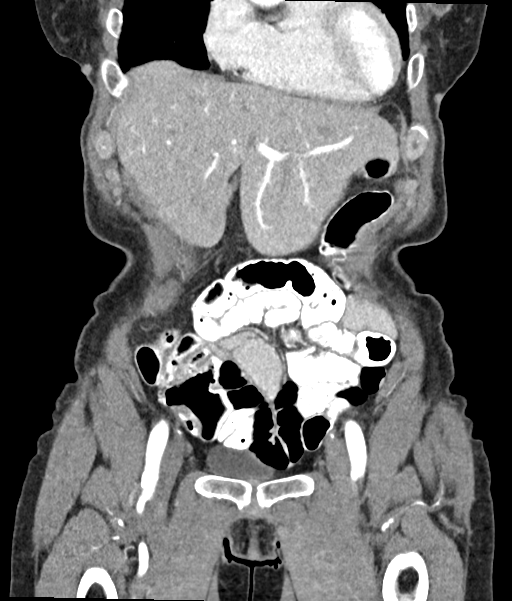
[im 57/129  soft-tissue]
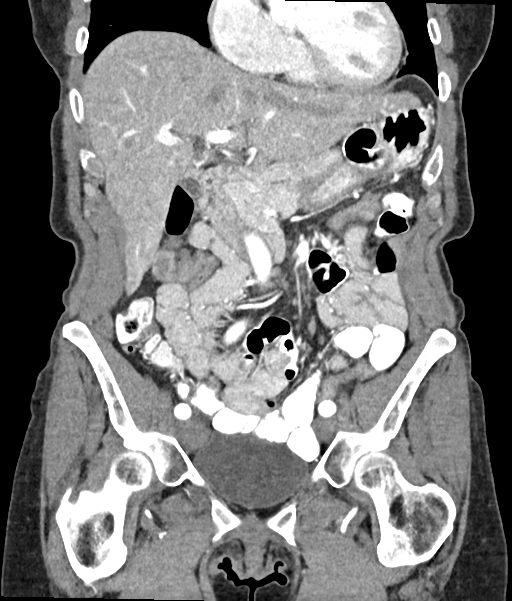
[im 72/129  soft-tissue]
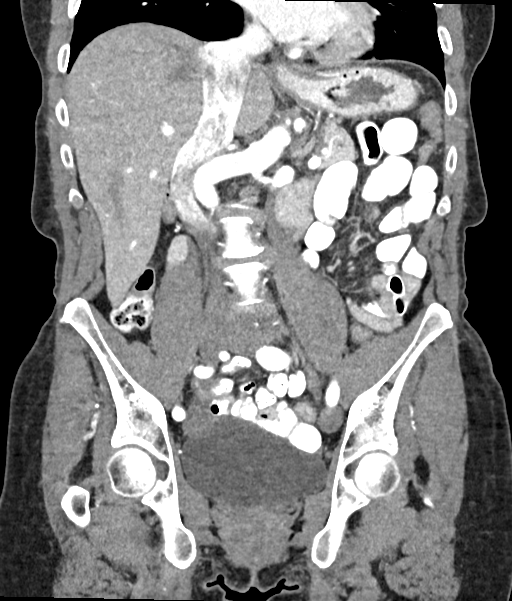

[16 of 46 positions shown; findings below may reference images not displayed]

FINDINGS: Lower chest: No significant pulmonary nodules or acute consolidative
airspace disease. Partially visualized bilateral breast prostheses.

Hepatobiliary: Normal liver size. No liver mass. Normal gallbladder
with no radiopaque cholelithiasis. No biliary ductal dilatation. CBD
diameter 5 mm

Pancreas: Normal, with no mass or duct dilation.

Spleen: Normal size. No mass.

Adrenals/Urinary Tract: Normal adrenals. Normal kidneys with no
hydronephrosis and no renal mass. Normal bladder.

Stomach/Bowel: Normal non-distended stomach. Normal caliber small
bowel with no small bowel wall thickening. Normal appendix. Oral
contrast transits to the colon. Moderate sigmoid diverticulosis with
no large bowel wall thickening or significant pericolonic fat
stranding.

Vascular/Lymphatic: Atherosclerotic nonaneurysmal tortuous abdominal
aorta. Patent portal, splenic, hepatic and renal veins. No
pathologically enlarged lymph nodes in the abdomen or pelvis.

Reproductive: Grossly normal uterus.  No adnexal mass.

Other: No pneumoperitoneum, ascites or focal fluid collection. Tiny
fat containing umbilical hernia.

Musculoskeletal: No aggressive appearing focal osseous lesions.
Moderate lumbar spondylosis.
IMPRESSION: 1. No acute abnormality. No evidence of bowel obstruction or acute
bowel inflammation. Moderate sigmoid diverticulosis, with no
evidence of acute diverticulitis.
2. Tiny fat containing umbilical hernia.
3. Aortic Atherosclerosis ([id number]).

## 2021-05-14 ENCOUNTER — Other Ambulatory Visit: Payer: Self-pay

## 2021-05-15 ENCOUNTER — Encounter: Payer: Self-pay | Admitting: Family Medicine

## 2021-05-15 ENCOUNTER — Ambulatory Visit (INDEPENDENT_AMBULATORY_CARE_PROVIDER_SITE_OTHER): Payer: 59 | Admitting: Family Medicine

## 2021-05-15 VITALS — BP 112/70 | HR 64 | Temp 98.2°F | Ht 62.0 in | Wt 133.8 lb

## 2021-05-15 DIAGNOSIS — L089 Local infection of the skin and subcutaneous tissue, unspecified: Secondary | ICD-10-CM | POA: Diagnosis not present

## 2021-05-15 MED ORDER — MUPIROCIN 2 % EX OINT: TOPICAL_OINTMENT | CUTANEOUS | 1 refills | Status: DC

## 2021-05-15 NOTE — Progress Notes (Signed)
[name]   Established Patient Office Visit  Subjective:  Patient ID: Jackie Hubbard, female    DOB: [birth year]  Age: 60 y.o. MRN: [id number]  CC:  Chief Complaint  Patient presents with   Blister    Patient complains of blister on her nose, Patient reports she notices blister after mowing the grass, x2 months    HPI Jackie Hubbard presents for recent issues of what sounds like recurrent small pustules inside her nose.  She is noted some recently inside the right naris.  She is noticing crusted drainage on the left side.  Interestingly, symptoms seem to be worse after mowing.  She does have occasional clear drainage.  No fever.  No known history of MRSA.  Denies any nosebleeds.  Past Medical History:  Diagnosis Date   Arthritis    knees   GERD (gastroesophageal reflux disease)    with certain foods   History of shingles    LGSIL (low grade squamous intraepithelial dysplasia) 2009   Osteopenia 12/2013   T score -1.2 FRAX 5.7%/0.3%    Past Surgical History:  Procedure Laterality Date   AUGMENTATION MAMMAPLASTY Bilateral 2003   BLADDER SUSPENSION  2006   COLONOSCOPY  04/29/2009   JP-normal-10 yr recall-mod tics   ROTATOR CUFF REPAIR Right 12/2019   TOTAL KNEE ARTHROPLASTY Left 09/22/2020   Procedure: TOTAL KNEE ARTHROPLASTY;  Surgeon: Gaynelle Arabian, MD;  Location: [facility];  Service: Orthopedics;  Laterality: Left;   WISDOM TOOTH EXTRACTION      Family History  Problem Relation Age of Onset   Leukemia Mother    Arthritis Father    Hypothyroidism Sister    Breast cancer Neg Hx    Colon polyps Neg Hx    Colon cancer Neg Hx    Esophageal cancer Neg Hx    Rectal cancer Neg Hx    Stomach cancer Neg Hx     Social History   Socioeconomic History   Marital status: Married    Spouse name: Not on file   Number of children: Not on file   Years of education: Not on file   Highest education level: Not on file  Occupational History   Not on file  Tobacco Use    Smoking status: Former    Packs/day: 1.00    Years: 10.00    Pack years: 10.00    Types: Cigarettes    Quit date: 9    Years since quitting: 27.6   Smokeless tobacco: Never  Vaping Use   Vaping Use: Never used  Substance and Sexual Activity   Alcohol use: Not Currently   Drug use: No   Sexual activity: Yes    Birth control/protection: Post-menopausal    Comment: 1st intercourse 60 yo-More than 5 partners  Other Topics Concern   Not on file  Social History Narrative   Not on file   Social Determinants of Health   Financial Resource Strain: Not on file  Food Insecurity: Not on file  Transportation Needs: Not on file  Physical Activity: Not on file  Stress: Not on file  Social Connections: Not on file  Intimate Partner Violence: Not on file    Outpatient Medications Prior to Visit  Medication Sig Dispense Refill   acetaminophen (TYLENOL) 325 MG tablet Take 650 mg by mouth every 6 (six) hours as needed for moderate pain.     methocarbamol (ROBAXIN) 500 MG tablet Take 1 tablet (500 mg total) by mouth every 6 (six) hours as needed for muscle spasms.  40 tablet 0   Multiple Vitamins-Minerals (MULTIVITAMIN WITH MINERALS) tablet Take 1 tablet by mouth daily.     No facility-administered medications prior to visit.    Allergies  Allergen Reactions   Penicillins Other (See Comments)    Reaction as a child  Tolerated Cephalosporin Date: 09/23/20.      ROS Review of Systems  Constitutional:  Negative for chills and fever.  HENT:  Positive for rhinorrhea. Negative for facial swelling, nosebleeds, sinus pressure and sinus pain.      Objective:    Physical Exam Vitals reviewed.  Constitutional:      Appearance: Normal appearance.  HENT:     Nose:     Comments: She has 1 very small pustule just inside the right naris.  This is only about a millimeter in size.  No vesicles.  Left naris reveals some crusted type drainage near the opening.  No visible  pustules. Cardiovascular:     Rate and Rhythm: Normal rate and regular rhythm.  Neurological:     Mental Status: She is alert.    BP 112/70 (BP Location: Left Arm, Patient Position: Sitting, Cuff Size: Normal)   Pulse 64   Temp 98.2 F (36.8 C) (Oral)   Ht '5\' 2"'$  (1.575 m)   Wt 133 lb 12.8 oz (60.7 kg)   SpO2 98%   BMI 24.47 kg/m  Wt Readings from Last 3 Encounters:  05/15/21 133 lb 12.8 oz (60.7 kg)  04/30/21 133 lb (60.3 kg)  04/16/21 133 lb (60.3 kg)     Health Maintenance Due  Topic Date Due   HIV Screening  Never done   COVID-19 Vaccine (4 - Booster for Pfizer series) 12/01/2020   INFLUENZA VACCINE  05/11/2021    There are no preventive care reminders to display for this patient.  Lab Results  Component Value Date   TSH 1.30 03/25/2021   Lab Results  Component Value Date   WBC 5.2 03/25/2021   HGB 13.7 03/25/2021   HCT 40.0 03/25/2021   MCV 90.2 03/25/2021   PLT 222.0 03/25/2021   Lab Results  Component Value Date   NA 138 03/25/2021   K 4.4 03/25/2021   CO2 27 03/25/2021   GLUCOSE 82 03/25/2021   BUN 24 (H) 03/25/2021   CREATININE 0.90 03/25/2021   BILITOT 0.5 03/25/2021   ALKPHOS 71 03/25/2021   AST 19 03/25/2021   ALT 14 03/25/2021   PROT 6.9 03/25/2021   ALBUMIN 4.5 03/25/2021   CALCIUM 9.6 03/25/2021   ANIONGAP 10 09/23/2020   GFR 68.66 03/25/2021   Lab Results  Component Value Date   CHOL 216 (H) 03/25/2021   Lab Results  Component Value Date   HDL 82.50 03/25/2021   Lab Results  Component Value Date   LDLCALC 123 (H) 03/25/2021   Lab Results  Component Value Date   TRIG 53.0 03/25/2021   Lab Results  Component Value Date   CHOLHDL 3 03/25/2021   No results found for: HGBA1C    Assessment & Plan:   Patient relates pustular type changes in the left naris and right naris recently.  No known history of MRSA.  Does not have any facial cellulitis, fever, etc.  -Recommend trial of Bactroban ointment to use intranasally with  Q-tip swab at least twice daily and be in touch if no symptoms not improving over the next week or so.  Meds ordered this encounter  Medications   mupirocin ointment (BACTROBAN) 2 %    Sig:  Apply Bactroban intranasally with Q-tip swab twice daily as needed.    Dispense:  22 g    Refill:  1    Follow-up: No follow-ups on file.    Carolann Littler, MD

## 2021-05-15 NOTE — Patient Instructions (Signed)
Use the Bactroban ointment intranasally twice daily until infection cleared.  Let me know if symptoms persist.

## 2021-06-09 ENCOUNTER — Other Ambulatory Visit: Payer: Self-pay | Admitting: Family Medicine

## 2021-06-09 ENCOUNTER — Ambulatory Visit
Admission: RE | Admit: 2021-06-09 | Discharge: 2021-06-09 | Disposition: A | Discharge status: Home / Self Care | Payer: 59 | Source: Ambulatory Visit

## 2021-06-09 ENCOUNTER — Other Ambulatory Visit: Payer: Self-pay

## 2021-06-09 DIAGNOSIS — Z1231 Encounter for screening mammogram for malignant neoplasm of breast: Secondary | ICD-10-CM

## 2021-06-24 ENCOUNTER — Telehealth: Payer: Self-pay

## 2021-06-24 NOTE — Telephone Encounter (Signed)
Patient called requesting Rx refill on methocarbamol (ROBAXIN) 500 MG tablet

## 2021-06-24 NOTE — Telephone Encounter (Signed)
Please advise. Should the patient continue this medication? 

## 2021-06-25 MED ORDER — METHOCARBAMOL 500 MG PO TABS: 500.0000 mg | ORAL_TABLET | Freq: Four times a day (QID) | ORAL | 0 refills | Status: DC | PRN

## 2021-06-25 NOTE — Telephone Encounter (Signed)
Rx sent in

## 2021-06-25 NOTE — Addendum Note (Signed)
Addended by: Rebecca Eaton on: 06/25/2021 07:58 AM   Modules accepted: Orders

## 2021-08-21 ENCOUNTER — Ambulatory Visit (INDEPENDENT_AMBULATORY_CARE_PROVIDER_SITE_OTHER): Payer: 59 | Admitting: Family Medicine

## 2021-08-21 VITALS — BP 120/60 | HR 60 | Temp 98.0°F | Wt 138.9 lb

## 2021-08-21 DIAGNOSIS — M542 Cervicalgia: Secondary | ICD-10-CM | POA: Diagnosis not present

## 2021-08-21 MED ORDER — CYCLOBENZAPRINE HCL 5 MG PO TABS: ORAL_TABLET | ORAL | 1 refills | Status: DC

## 2021-08-21 NOTE — Progress Notes (Signed)
Established Patient Office Visit  Subjective:  Patient ID: Jackie Hubbard, female    DOB: 08-24-1961  Age: 60 y.o. MRN: [id number]  CC:  Chief Complaint  Patient presents with   Neck Pain    X 2 weeks, very painful to move, unable to turn to the right or look up    HPI Jackie Hubbard presents for right-sided neck pain for several months.  Pain is up near the attachment of paracervical muscles to the occiput and she feels like this is muscular.  She had been to the chiropractor but they did not think this was skeletal in nature.  Denies any radiculitis symptoms.  She has pain when rotating head to the right side or looking up.  Sometimes radiates toward the right ear.  Denies any upper extremity numbness or weakness.  She does do some rowing for exercise.  She has tried over-the-counter anti-inflammatory such as ibuprofen with minimal relief.  Past Medical History:  Diagnosis Date   Arthritis    knees   GERD (gastroesophageal reflux disease)    with certain foods   History of shingles    LGSIL (low grade squamous intraepithelial dysplasia) 2009   Osteopenia 12/2013   T score -1.2 FRAX 5.7%/0.3%    Past Surgical History:  Procedure Laterality Date   AUGMENTATION MAMMAPLASTY Bilateral 2003   BLADDER SUSPENSION  2006   COLONOSCOPY  04/29/2009   JP-normal-10 yr recall-mod tics   ROTATOR CUFF REPAIR Right 12/2019   TOTAL KNEE ARTHROPLASTY Left 09/22/2020   Procedure: TOTAL KNEE ARTHROPLASTY;  Surgeon: Gaynelle Arabian, MD;  Location: [facility];  Service: Orthopedics;  Laterality: Left;   WISDOM TOOTH EXTRACTION      Family History  Problem Relation Age of Onset   Leukemia Mother    Arthritis Father    Hypothyroidism Sister    Breast cancer Neg Hx    Colon polyps Neg Hx    Colon cancer Neg Hx    Esophageal cancer Neg Hx    Rectal cancer Neg Hx    Stomach cancer Neg Hx     Social History   Socioeconomic History   Marital status: Married    Spouse name: Not on file    Number of children: Not on file   Years of education: Not on file   Highest education level: Not on file  Occupational History   Not on file  Tobacco Use   Smoking status: Former    Packs/day: 1.00    Years: 10.00    Pack years: 10.00    Types: Cigarettes    Quit date: 57    Years since quitting: 27.8   Smokeless tobacco: Never  Vaping Use   Vaping Use: Never used  Substance and Sexual Activity   Alcohol use: Not Currently   Drug use: No   Sexual activity: Yes    Birth control/protection: Post-menopausal    Comment: 1st intercourse 60 yo-More than 5 partners  Other Topics Concern   Not on file  Social History Narrative   Not on file   Social Determinants of Health   Financial Resource Strain: Not on file  Food Insecurity: Not on file  Transportation Needs: Not on file  Physical Activity: Not on file  Stress: Not on file  Social Connections: Not on file  Intimate Partner Violence: Not on file    Outpatient Medications Prior to Visit  Medication Sig Dispense Refill   acetaminophen (TYLENOL) 325 MG tablet Take 650 mg by mouth every 6 (  six) hours as needed for moderate pain.     methocarbamol (ROBAXIN) 500 MG tablet Take 1 tablet (500 mg total) by mouth every 6 (six) hours as needed for muscle spasms. 40 tablet 0   Multiple Vitamins-Minerals (MULTIVITAMIN WITH MINERALS) tablet Take 1 tablet by mouth daily.     mupirocin ointment (BACTROBAN) 2 % Apply Bactroban intranasally with Q-tip swab twice daily as needed. 22 g 1   No facility-administered medications prior to visit.    Allergies  Allergen Reactions   Penicillins Other (See Comments)    Reaction as a child  Tolerated Cephalosporin Date: 09/23/20.      ROS Review of Systems  Cardiovascular:  Negative for chest pain.  Musculoskeletal:  Positive for neck pain.  Neurological:  Negative for weakness and numbness.     Objective:    Physical Exam Vitals reviewed.  Neck:     Comments: She has some  muscular tenderness near the base of the occiput right side. Musculoskeletal:     Cervical back: Neck supple.  Neurological:     General: No focal deficit present.    BP 120/60 (BP Location: Left Arm, Patient Position: Sitting, Cuff Size: Normal)   Pulse 60   Temp 98 F (36.7 C) (Oral)   Wt 138 lb 14.4 oz (63 kg)   SpO2 96%   BMI 25.41 kg/m  Wt Readings from Last 3 Encounters:  08/21/21 138 lb 14.4 oz (63 kg)  05/15/21 133 lb 12.8 oz (60.7 kg)  04/30/21 133 lb (60.3 kg)     Health Maintenance Due  Topic Date Due   HIV Screening  Never done   COVID-19 Vaccine (4 - Booster for Pfizer series) 09/25/2020   INFLUENZA VACCINE  05/11/2021    There are no preventive care reminders to display for this patient.  Lab Results  Component Value Date   TSH 1.30 03/25/2021   Lab Results  Component Value Date   WBC 5.2 03/25/2021   HGB 13.7 03/25/2021   HCT 40.0 03/25/2021   MCV 90.2 03/25/2021   PLT 222.0 03/25/2021   Lab Results  Component Value Date   NA 138 03/25/2021   K 4.4 03/25/2021   CO2 27 03/25/2021   GLUCOSE 82 03/25/2021   BUN 24 (H) 03/25/2021   CREATININE 0.90 03/25/2021   BILITOT 0.5 03/25/2021   ALKPHOS 71 03/25/2021   AST 19 03/25/2021   ALT 14 03/25/2021   PROT 6.9 03/25/2021   ALBUMIN 4.5 03/25/2021   CALCIUM 9.6 03/25/2021   ANIONGAP 10 09/23/2020   GFR 68.66 03/25/2021   Lab Results  Component Value Date   CHOL 216 (H) 03/25/2021   Lab Results  Component Value Date   HDL 82.50 03/25/2021   Lab Results  Component Value Date   LDLCALC 123 (H) 03/25/2021   Lab Results  Component Value Date   TRIG 53.0 03/25/2021   Lab Results  Component Value Date   CHOLHDL 3 03/25/2021   No results found for: HGBA1C    Assessment & Plan:   Right-sided cervical neck pains.  Suspect muscular.  Recommend short-term trial of Flexeril 5 mg take 1 to 2 tablets nightly.  Recommend frequent neck stretching with extension.  Consider muscle massage.   Continue anti-inflammatory medications as needed Consider physical therapy if not improving in a few weeks  Meds ordered this encounter  Medications   cyclobenzaprine (FLEXERIL) 5 MG tablet    Sig: Take one to two tablets qhs prn neck muscle spasm  Dispense:  40 tablet    Refill:  1    Follow-up: No follow-ups on file.    Carolann Littler, MD

## 2022-02-03 ENCOUNTER — Ambulatory Visit: Payer: 59 | Admitting: Nurse Practitioner

## 2022-04-30 ENCOUNTER — Ambulatory Visit (INDEPENDENT_AMBULATORY_CARE_PROVIDER_SITE_OTHER): Payer: 59 | Admitting: Family Medicine

## 2022-04-30 ENCOUNTER — Encounter: Payer: Self-pay | Admitting: Family Medicine

## 2022-04-30 VITALS — BP 90/60 | HR 68 | Temp 97.7°F | Ht 62.0 in | Wt 137.6 lb

## 2022-04-30 DIAGNOSIS — K439 Ventral hernia without obstruction or gangrene: Secondary | ICD-10-CM

## 2022-04-30 DIAGNOSIS — H9313 Tinnitus, bilateral: Secondary | ICD-10-CM | POA: Diagnosis not present

## 2022-04-30 NOTE — Progress Notes (Signed)
Established Patient Office Visit  Subjective   Patient ID: Jackie Hubbard, female    DOB: 10/11/1961  Age: 61 y.o. MRN: [id number]  Chief Complaint  Patient presents with   Tinnitus    Patient complains of tinnitus, x6 week     HPI   Tinnitus for 6 weeks.  Left ear slightly greater than right.  She had recent steroid injections in her neck and wonders if that may have triggered.  Denies any acute hearing changes.  No vertigo.  No pulsatile tinnitus.  Does not subjectively feel like she has had any decline in hearing..  No new medications.  Symptoms are relatively mild at this time.  Not interfering with sleep.  Second issue she has concern for possible abdominal hernias.  She notices bulge with things like coughing or straining or bending or lifting but has always been able to reduce these areas of swelling.  No significant pain.  Past Medical History:  Diagnosis Date   Arthritis    knees   GERD (gastroesophageal reflux disease)    with certain foods   History of shingles    LGSIL (low grade squamous intraepithelial dysplasia) 2009   Osteopenia 12/2013   T score -1.2 FRAX 5.7%/0.3%   Past Surgical History:  Procedure Laterality Date   AUGMENTATION MAMMAPLASTY Bilateral 2003   BLADDER SUSPENSION  2006   COLONOSCOPY  04/29/2009   JP-normal-10 yr recall-mod tics   ROTATOR CUFF REPAIR Right 12/2019   TOTAL KNEE ARTHROPLASTY Left 09/22/2020   Procedure: TOTAL KNEE ARTHROPLASTY;  Surgeon: Gaynelle Arabian, MD;  Location: [facility];  Service: Orthopedics;  Laterality: Left;   WISDOM TOOTH EXTRACTION      reports that she quit smoking about 28 years ago. Her smoking use included cigarettes. She has a 10.00 pack-year smoking history. She has never used smokeless tobacco. She reports that she does not currently use alcohol. She reports that she does not use drugs. family history includes Arthritis in her father; Hypothyroidism in her sister; Leukemia in her mother. Allergies   Allergen Reactions   Penicillins Other (See Comments)    Reaction as a child  Tolerated Cephalosporin Date: 09/23/20.      Review of Systems  Constitutional:  Negative for chills, fever and weight loss.  HENT:  Positive for tinnitus. Negative for ear discharge, ear pain and hearing loss.   Neurological:  Negative for dizziness and headaches.      Objective:     BP 90/60 (BP Location: Left Arm, Patient Position: Sitting, Cuff Size: Normal)   Pulse 68   Temp 97.7 F (36.5 C) (Oral)   Ht '5\' 2"'$  (1.575 m)   Wt 137 lb 9.6 oz (62.4 kg)   SpO2 97%   BMI 25.17 kg/m    Physical Exam Vitals reviewed.  Constitutional:      Appearance: Normal appearance.  Cardiovascular:     Rate and Rhythm: Normal rate and regular rhythm.  Pulmonary:     Effort: Pulmonary effort is normal.     Breath sounds: Normal breath sounds.  Abdominal:     Comments: May have small defect palpated right lower abdomen exacerbated by coughing.  Nontender.  Neurological:     General: No focal deficit present.     Mental Status: She is alert and oriented to person, place, and time.     Cranial Nerves: No cranial nerve deficit.     Sensory: No sensory deficit.     Motor: No weakness.     Coordination:  Coordination normal.      No results found for any visits on 04/30/22.    The 10-year ASCVD risk score (Arnett DK, et al., 2019) is: 1.9%    Assessment & Plan:   #1 tinnitus.  Symptoms are bilateral.  She does not have any red flags such as pulsatile tinnitus, hearing change, vertigo. -Consider audiology assessment as first step.  Watch out for any new symptoms such as above.  #2 probable small abdominal wall hernia.  Discussed possible general surgery referral but she declines at this time.  Watch for signs and symptoms of strangulation.  No follow-ups on file.    Carolann Littler, MD

## 2022-05-07 ENCOUNTER — Other Ambulatory Visit: Payer: Self-pay | Admitting: Family Medicine

## 2022-05-07 DIAGNOSIS — Z1231 Encounter for screening mammogram for malignant neoplasm of breast: Secondary | ICD-10-CM

## 2022-05-26 ENCOUNTER — Ambulatory Visit: Payer: 59 | Admitting: Nurse Practitioner

## 2022-06-03 ENCOUNTER — Encounter: Payer: Self-pay | Admitting: Nurse Practitioner

## 2022-06-03 ENCOUNTER — Ambulatory Visit (INDEPENDENT_AMBULATORY_CARE_PROVIDER_SITE_OTHER): Payer: 59 | Admitting: Nurse Practitioner

## 2022-06-03 ENCOUNTER — Other Ambulatory Visit (HOSPITAL_COMMUNITY)
Admission: RE | Admit: 2022-06-03 | Discharge: 2022-06-03 | Disposition: A | Discharge status: Home / Self Care | Payer: 59 | Source: Ambulatory Visit | Attending: Nurse Practitioner | Admitting: Nurse Practitioner

## 2022-06-03 VITALS — BP 104/70 | HR 60 | Ht 61.5 in | Wt 136.0 lb

## 2022-06-03 DIAGNOSIS — Z780 Asymptomatic menopausal state: Secondary | ICD-10-CM | POA: Diagnosis not present

## 2022-06-03 DIAGNOSIS — Z01419 Encounter for gynecological examination (general) (routine) without abnormal findings: Secondary | ICD-10-CM | POA: Diagnosis present

## 2022-06-03 DIAGNOSIS — M8589 Other specified disorders of bone density and structure, multiple sites: Secondary | ICD-10-CM | POA: Diagnosis not present

## 2022-06-03 NOTE — Progress Notes (Signed)
   Jackie Hubbard [birth year] [id number]   History:  61 y.o. G3P3 presents for annual exam without GYN complaints. Postmenopausal - no HRT, no bleeding. 2009 LGSIL, subsequent paps normal.   Gynecologic History No LMP recorded. Patient is postmenopausal.   Contraception/Family planning: post menopausal status Sexually active: Yes  Health Maintenance Last Pap: 01/25/2019. Results were: Normal Last mammogram: 06/09/2021. Results were: Normal. Scheduled 8/31 Last colonoscopy: 04/30/2021. Results were: Normal, 10-year recall Last Dexa: 02/17/2021. Results were: T-score -2.2, FRAX 11% / 1.8%  Past medical history, past surgical history, family history and social history were all reviewed and documented in the EPIC chart. 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  ROS:  A ROS was performed and pertinent positives and negatives are included.  Exam:  Vitals:   06/03/22 1345  BP: 104/70  Pulse: 60  SpO2: 99%  Weight: 136 lb (61.7 kg)  Height: 5' 1.5" (1.562 m)   Body mass index is 25.28 kg/m.  General appearance:  Normal Thyroid:  Symmetrical, normal in size, without palpable masses or nodularity. Respiratory  Auscultation:  Clear without wheezing or rhonchi Cardiovascular  Auscultation:  Regular rate, without rubs, murmurs or gallops  Edema/varicosities:  Not grossly evident Abdominal  Soft,nontender, without masses, guarding or rebound.  Liver/spleen:  No organomegaly noted  Hernia:  None appreciated  Skin  Inspection:  Grossly normal Breasts: Examined lying and sitting. Bilateral breast implants noted.   Right: Without masses, retractions, nipple discharge or axillary adenopathy.   Left: Without masses, retractions, nipple discharge or axillary adenopathy. Gentitourinary   Inguinal/mons:  Normal without inguinal adenopathy  External genitalia:  Normal appearing vulva with no masses, tenderness, or lesions  BUS/Urethra/Skene's glands:  Normal  Vagina:   Normal appearing with normal color and discharge, no lesions. Atrophic changes.   Cervix:  Normal appearing without discharge or lesions  Uterus:  Normal in size, shape and contour.  Midline and mobile, nontender  Adnexa/parametria:     Rt: Normal in size, without masses or tenderness.   Lt: Normal in size, without masses or tenderness.  Anus and perineum: Normal  Digital rectal exam: Normal sphincter tone without palpated masses or tenderness  Assessment/Plan:  61 y.o. G3P3 for annual exam.    Well female exam with routine gynecological exam - Plan: Cytology - PAP( [facility]). Education provided on SBEs, importance of preventative screenings, current guidelines, high calcium diet, regular exercise, and multivitamin daily. Labs with PCP.   Postmenopausal - no HRT, no bleeding  Osteopenia of multiple sites - T-score -2.2 in May 2022. Recommend Vitamin + calcium and regular exercise. 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   Screening for cervical cancer - Normal Pap history. Pap today.   Screening for breast cancer - Normal mammogram history.  Continue annual screenings.  Normal breast exam today.  Screening for colon cancer - 2022 colonoscopy. Will repeat at GI's recommended interval.   Return in 1 year for annual.    Tamela Gammon DNP, 2:20 PM 06/03/2022

## 2022-06-09 LAB — CYTOLOGY - PAP: Diagnosis: NEGATIVE

## 2022-06-10 ENCOUNTER — Ambulatory Visit: Payer: 59

## 2022-06-25 ENCOUNTER — Ambulatory Visit
Admission: RE | Admit: 2022-06-25 | Discharge: 2022-06-25 | Disposition: A | Discharge status: Home / Self Care | Payer: 59 | Source: Ambulatory Visit | Attending: Family Medicine | Admitting: Family Medicine

## 2022-06-25 DIAGNOSIS — Z1231 Encounter for screening mammogram for malignant neoplasm of breast: Secondary | ICD-10-CM

## 2023-05-26 ENCOUNTER — Other Ambulatory Visit: Payer: Self-pay | Admitting: Family Medicine

## 2023-05-26 DIAGNOSIS — Z1231 Encounter for screening mammogram for malignant neoplasm of breast: Secondary | ICD-10-CM

## 2023-06-28 ENCOUNTER — Ambulatory Visit
Admission: RE | Admit: 2023-06-28 | Discharge: 2023-06-28 | Disposition: A | Discharge status: Home / Self Care | Payer: 59 | Source: Ambulatory Visit | Attending: Family Medicine | Admitting: Family Medicine

## 2023-06-28 DIAGNOSIS — Z1231 Encounter for screening mammogram for malignant neoplasm of breast: Secondary | ICD-10-CM

## 2023-06-30 ENCOUNTER — Ambulatory Visit: Payer: Managed Care, Other (non HMO) | Admitting: Nurse Practitioner

## 2023-06-30 ENCOUNTER — Encounter: Payer: Self-pay | Admitting: Nurse Practitioner

## 2023-06-30 VITALS — BP 110/66 | HR 72 | Ht 61.5 in | Wt 125.0 lb

## 2023-06-30 DIAGNOSIS — Z01419 Encounter for gynecological examination (general) (routine) without abnormal findings: Secondary | ICD-10-CM | POA: Diagnosis not present

## 2023-06-30 DIAGNOSIS — Z780 Asymptomatic menopausal state: Secondary | ICD-10-CM | POA: Diagnosis not present

## 2023-06-30 DIAGNOSIS — M8589 Other specified disorders of bone density and structure, multiple sites: Secondary | ICD-10-CM | POA: Diagnosis not present

## 2023-06-30 NOTE — Progress Notes (Signed)
Jackie Hubbard [birth year] [id number]   History:  62 y.o. G3P3 presents for annual exam without GYN complaints. Postmenopausal - no HRT, no bleeding. 2009 LGSIL, subsequent paps normal.   Gynecologic History No LMP recorded. Patient is postmenopausal.   Contraception/Family planning: post menopausal status Sexually active: Yes  Health Maintenance Last Pap: 06/03/2022. Results were: Normal, 3-year repeat Last mammogram: 06/28/2023. Results were: Normal Last colonoscopy: 04/30/2021. Results were: Normal, 10-year recall Last Dexa: 02/17/2021. Results were: T-score -2.2, FRAX 11% / 1.8%  Past medical history, past surgical history, family history and social history were all reviewed and documented in the EPIC chart. Married. Works remote in Visual merchandiser. One daughter getting married in November. Other daughter works for [employer].   ROS:  A ROS was performed and pertinent positives and negatives are included.  Exam:  Vitals:   06/30/23 1456  BP: 110/66  Pulse: 72  SpO2: 100%  Weight: 125 lb (56.7 kg)  Height: 5' 1.5" (1.562 m)    Body mass index is 23.24 kg/m.  General appearance:  Normal Thyroid:  Symmetrical, normal in size, without palpable masses or nodularity. Respiratory  Auscultation:  Clear without wheezing or rhonchi Cardiovascular  Auscultation:  Regular rate, without rubs, murmurs or gallops  Edema/varicosities:  Not grossly evident Abdominal  Soft,nontender, without masses, guarding or rebound.  Liver/spleen:  No organomegaly noted  Hernia:  None appreciated  Skin  Inspection:  Grossly normal Breasts: Examined lying and sitting. Bilateral breast implants noted.   Right: Without masses, retractions, nipple discharge or axillary adenopathy.   Left: Without masses, retractions, nipple discharge or axillary adenopathy. Pelvic: External genitalia:  no lesions              Urethra:  normal appearing urethra with no masses, tenderness or lesions               Bartholins and Skenes: normal                 Vagina: normal appearing vagina with normal color and discharge, no lesions. Atrophic changes              Cervix: no lesions Bimanual Exam:  Uterus:  no masses or tenderness              Adnexa: no mass, fullness, tenderness              Rectovaginal: Deferred              Anus:  normal, no lesions   Patient informed chaperone available to be present for breast and pelvic exam. Patient has requested no chaperone to be present. Patient has been advised what will be completed during breast and pelvic exam.   Assessment/Plan:  61 y.o. G3P3 for annual exam.    Well female exam with routine gynecological exam - Education provided on SBEs, importance of preventative screenings, current guidelines, high calcium diet, regular exercise, and multivitamin daily. Labs with PCP.   Postmenopausal - no HRT, no bleeding  Osteopenia of multiple sites - Plan: DG Bone Density. T-score -2.2 in May 2022. Recommend Vitamin D + calcium and regular exercise. She is a Catering manager.   Screening for cervical cancer - Normal Pap history. Will repeat at 3-year interval per guidelines.   Screening for breast cancer - Normal mammogram history.  Continue annual screenings.  Normal breast exam today.  Screening for colon cancer - 2022 colonoscopy. Will repeat at GI's recommended interval.   Return in 1 year for annual  or sooner if needed.      Olivia Mackie DNP, 3:17 PM 06/30/2023

## 2023-07-01 ENCOUNTER — Other Ambulatory Visit: Payer: Self-pay | Admitting: Nurse Practitioner

## 2023-07-01 DIAGNOSIS — M8589 Other specified disorders of bone density and structure, multiple sites: Secondary | ICD-10-CM

## 2023-09-21 ENCOUNTER — Encounter: Payer: Self-pay | Admitting: Podiatry

## 2023-09-21 ENCOUNTER — Ambulatory Visit (INDEPENDENT_AMBULATORY_CARE_PROVIDER_SITE_OTHER): Payer: Managed Care, Other (non HMO)

## 2023-09-21 ENCOUNTER — Ambulatory Visit (INDEPENDENT_AMBULATORY_CARE_PROVIDER_SITE_OTHER): Payer: Managed Care, Other (non HMO) | Admitting: Podiatry

## 2023-09-21 DIAGNOSIS — M2042 Other hammer toe(s) (acquired), left foot: Secondary | ICD-10-CM

## 2023-09-21 DIAGNOSIS — M7752 Other enthesopathy of left foot: Secondary | ICD-10-CM | POA: Diagnosis not present

## 2023-09-21 NOTE — Progress Notes (Signed)
Subjective:   Patient ID: Jackie Hubbard, female   DOB: 62 y.o.   MRN: [id number]   HPI Patient presents with several different problems with 1 being elevated second digit left significant pain with lesion formation fifth digit left foot lateral side with fluid buildup and lesion around the first metatarsal head right foot.  States that the toe has been this way for a fairly long time she is very active does not smoke   Review of Systems  All other systems reviewed and are negative.       Objective:  Physical Exam Vitals and nursing note reviewed.  Constitutional:      Appearance: She is well-developed.  Pulmonary:     Effort: Pulmonary effort is normal.  Musculoskeletal:        General: Normal range of motion.  Skin:    General: Skin is warm.  Neurological:     Mental Status: She is alert.     Neurovascular status intact muscle strength found to be adequate range of motion adequate with patient noted to have inflammation and pain of the fifth digit left foot with rotated toe noted and also noted to have elevated second digit right moderately rigid and keratotic lesion of the first metatarsal right foot     Assessment:  Hammertoe deformity second and fifth digit left with fluid buildup fifth digit left with keratotic lesion and normal keratotic tissue right     Plan:  H&P all conditions reviewed and do not recommend treating the second digit but if it gets worse may require digital fusion and for the fifth toe I did sterile prep injected the inner phalangeal joint 3 mg dexamethasone Kenalog 5 mg Xylocaine courtesy debridement of tissue applied cushioning will be seen back may require arthroplasty  X-rays indicate significant rotation fifth digit bilateral moderate elevation second digit left with both medial and dorsal dislocation of the toe

## 2023-11-10 ENCOUNTER — Encounter: Payer: Self-pay | Admitting: Podiatry

## 2023-11-10 ENCOUNTER — Ambulatory Visit: Payer: Managed Care, Other (non HMO) | Admitting: Podiatry

## 2023-11-10 DIAGNOSIS — L03032 Cellulitis of left toe: Secondary | ICD-10-CM | POA: Diagnosis not present

## 2023-11-10 DIAGNOSIS — L02612 Cutaneous abscess of left foot: Secondary | ICD-10-CM | POA: Diagnosis not present

## 2023-11-10 MED ORDER — DOXYCYCLINE HYCLATE 100 MG PO TABS: 100.0000 mg | ORAL_TABLET | Freq: Two times a day (BID) | ORAL | 0 refills | Status: DC

## 2023-11-10 NOTE — Progress Notes (Signed)
Subjective:   Patient ID: Jackie Hubbard, female   DOB: 63 y.o.   MRN: [id number]   HPI Patient presents stating the left fifth toe has become swollen over the last few days and she is not sure what may have happened   ROS      Objective:  Physical Exam  Neurovascular status intact inflammation pain around the head of the proximal phalanx digit 5 left small lesion redness of the toe     Assessment:  Hammertoe deformity fifth left with inflammatory change possibility for low-grade infection with pressure     Plan:  Reviewed that this toe may need to get fixed but at this point I went ahead and I used sterile sharp instrumentation debrided the lesion I then advised on soaks and open toed shoes and placed on doxycycline twice daily for 10 days.  Reappoint as symptoms indicate

## 2023-12-01 ENCOUNTER — Ambulatory Visit: Payer: Managed Care, Other (non HMO) | Admitting: Podiatry

## 2023-12-02 ENCOUNTER — Ambulatory Visit (INDEPENDENT_AMBULATORY_CARE_PROVIDER_SITE_OTHER): Payer: Managed Care, Other (non HMO) | Admitting: Podiatry

## 2023-12-02 DIAGNOSIS — M7752 Other enthesopathy of left foot: Secondary | ICD-10-CM | POA: Diagnosis not present

## 2023-12-02 DIAGNOSIS — M2042 Other hammer toe(s) (acquired), left foot: Secondary | ICD-10-CM | POA: Diagnosis not present

## 2023-12-03 NOTE — Progress Notes (Signed)
 Subjective:   Patient ID: Jackie Hubbard, female   DOB: 63 y.o.   MRN: [id number]   HPI Patient states it seems to be somewhat improved but still can be bothersome and she is just starting to wear other types of shoes neuro   ROS      Objective:  Physical Exam  Vascular status intact keratotic lesion around the fifth digit left fluid buildup around this area moderately painful when pressed but in proved with no indication of current infection     Assessment:  Hammertoe deformity fifth left with history of infection that appears to be resolved currently     Plan:  H&P reviewed debrided the area with sharp instrumentation courtesy discussed arthroplasty which may be necessary depending on response and she agrees this may be necessary but would like to wait till November if possible.  We will see how she does in her shoes I can continue to try conservative care until that time but I did review ultimate arthroplasty today

## 2024-03-12 ENCOUNTER — Ambulatory Visit (INDEPENDENT_AMBULATORY_CARE_PROVIDER_SITE_OTHER): Payer: Self-pay | Admitting: Family Medicine

## 2024-03-12 ENCOUNTER — Encounter: Payer: Self-pay | Admitting: Family Medicine

## 2024-03-12 VITALS — BP 116/70 | HR 64 | Temp 97.9°F | Wt 129.4 lb

## 2024-03-12 DIAGNOSIS — M549 Dorsalgia, unspecified: Secondary | ICD-10-CM | POA: Diagnosis not present

## 2024-03-12 DIAGNOSIS — R1031 Right lower quadrant pain: Secondary | ICD-10-CM | POA: Diagnosis not present

## 2024-03-12 NOTE — Patient Instructions (Signed)
 Watch for any abdominal bulge to suggest hernia.    I will get back with you regarding plastic surgeons.

## 2024-03-12 NOTE — Progress Notes (Signed)
 Established Patient Office Visit  Subjective   Patient ID: Jackie Hubbard, female    DOB: [birth year]  Age: 63 y.o. MRN: [id number]  Chief Complaint  Patient presents with   Abdominal Pain   Back Pain    HPI    Jackie Hubbard is seen with some recent abdominal pain and concern for possible abdominal hernia.  She has occasionally noted pains in her right lower quadrant but also left lower quadrant as well.  She has couple times she felt possible bulge right lower quadrant.  No history of hernia.  No history of abdominal surgery.  No umbilical herniation.  She does regular frequent core exercises and has not noted any of her core exercises exacerbating any bulging.  Generally feels well overall.  No change in bowel habits.  Last colonoscopy 7/22  She does have some chronic neck and upper back difficulties and has seen orthopedics in the past.  She is wondering about possible breast reduction.  She has large breasts and feels like this mechanically is a disadvantage in creating more back pain.  She is inquiring about possible referral to plastic surgeon to initiate discussions regarding breast reduction  Past Medical History:  Diagnosis Date   Arthritis    knees   GERD (gastroesophageal reflux disease)    with certain foods   History of shingles    LGSIL (low grade squamous intraepithelial dysplasia) 2009   Osteopenia 12/2013   T score -1.2 FRAX 5.7%/0.3%   Past Surgical History:  Procedure Laterality Date   AUGMENTATION MAMMAPLASTY Bilateral 2003   BLADDER SUSPENSION  2006   COLONOSCOPY  04/29/2009   JP-normal-10 yr recall-mod tics   ROTATOR CUFF REPAIR Right 12/2019   TOTAL KNEE ARTHROPLASTY Left 09/22/2020   Procedure: TOTAL KNEE ARTHROPLASTY;  Surgeon: Liliane Rei, MD;  Location: [facility];  Service: Orthopedics;  Laterality: Left;   WISDOM TOOTH EXTRACTION      reports that she quit smoking about 30 years ago. Her smoking use included cigarettes. She started smoking about 40  years ago. She has a 10 pack-year smoking history. She has never used smokeless tobacco. She reports that she does not currently use alcohol. She reports that she does not use drugs. family history includes Arthritis in her father; Hypothyroidism in her sister; Leukemia in her mother. Allergies  Allergen Reactions   Penicillins Other (See Comments)    Reaction as a child  Tolerated Cephalosporin Date: 09/23/20.      Review of Systems  Constitutional:  Negative for weight loss.  Respiratory:  Negative for shortness of breath.   Cardiovascular:  Negative for chest pain.  Gastrointestinal:  Negative for blood in stool, constipation, diarrhea, melena, nausea and vomiting.      Objective:     BP 116/70 (BP Location: Left Arm, Patient Position: Sitting, Cuff Size: Normal)   Pulse 64   Temp 97.9 F (36.6 C) (Oral)   Wt 129 lb 6.4 oz (58.7 kg)   SpO2 98%   BMI 24.05 kg/m  BP Readings from Last 3 Encounters:  03/12/24 116/70  06/30/23 110/66  06/03/22 104/70   Wt Readings from Last 3 Encounters:  03/12/24 129 lb 6.4 oz (58.7 kg)  06/30/23 125 lb (56.7 kg)  06/03/22 136 lb (61.7 kg)      Physical Exam Vitals reviewed.  Constitutional:      General: She is not in acute distress.    Appearance: She is not ill-appearing.  Cardiovascular:     Rate and Rhythm: Normal  rate and regular rhythm.  Pulmonary:     Effort: Pulmonary effort is normal.     Breath sounds: Normal breath sounds. No wheezing or rales.  Abdominal:     Comments: Nondistended.  Soft.  No reproducible tenderness.  Could not appreciate any bulging or obvious hernia masses with elevating lower extremities off table.  No obvious umbilical hernia or ventral hernia.  Neurological:     Mental Status: She is alert.      No results found for any visits on 03/12/24.    The 10-year ASCVD risk score (Arnett DK, et al., 2019) is: 4.1%    Assessment & Plan:   Intermittent ,predominantly right lower quadrant  pain.  She wonders if this could possibly be related to cramps.  No obvious hernia noted on exam.  We have recommended close observation and keep a diary.  Make sure she is staying well-hydrated and good electrolyte replacement with potassium and magnesium.  Follow-up promptly for any obvious bulge or other new findings  We consider referral to plastic surgeon to discuss possible breast reduction as she has some chronic intermittent upper back and neck pain.   Glean Lamy, MD

## 2024-03-14 ENCOUNTER — Telehealth: Payer: Self-pay | Admitting: Family Medicine

## 2024-03-14 DIAGNOSIS — M549 Dorsalgia, unspecified: Secondary | ICD-10-CM

## 2024-03-14 NOTE — Telephone Encounter (Signed)
 As per recent office visit, patient had discussed possible referral for consideration for breast reduction surgery.  She has had some chronic neck and upper back pain.  Will set up with local plastic surgeon to initiate discussion  Jackie Sit MD [facility]

## 2024-04-09 ENCOUNTER — Ambulatory Visit (INDEPENDENT_AMBULATORY_CARE_PROVIDER_SITE_OTHER): Payer: Self-pay | Admitting: Plastic Surgery

## 2024-04-09 ENCOUNTER — Encounter: Payer: Self-pay | Admitting: Plastic Surgery

## 2024-04-09 VITALS — BP 103/65 | HR 57 | Ht 63.0 in | Wt 128.8 lb

## 2024-04-09 DIAGNOSIS — N62 Hypertrophy of breast: Secondary | ICD-10-CM | POA: Insufficient documentation

## 2024-04-09 DIAGNOSIS — G8929 Other chronic pain: Secondary | ICD-10-CM

## 2024-04-09 DIAGNOSIS — M546 Pain in thoracic spine: Secondary | ICD-10-CM

## 2024-04-09 DIAGNOSIS — M549 Dorsalgia, unspecified: Secondary | ICD-10-CM | POA: Insufficient documentation

## 2024-04-09 DIAGNOSIS — Z719 Counseling, unspecified: Secondary | ICD-10-CM | POA: Insufficient documentation

## 2024-04-09 NOTE — Progress Notes (Signed)
 Patient ID: Jackie Hubbard, female    DOB: [birth year], 63 y.o.   MRN: [id number]   Chief Complaint  Patient presents with   Advice Only    The patient is a 63 y.o. female here for evaluation of her breast.  She underwent breast implant placement twice in the past.  The most recent time was approximately 23 years ago.  She had the implants placed in [facility] by Dr. Viktoria.  She thinks that they are silicone.  The first set were probably saline.  She is likely a double D cup size and she would like to go down to a pea.  She does not have a family history of breast cancer.  Her mammogram was in September 2024 was negative.  She is not a smoker and does not have diabetes.  She has some obvious ptosis bilaterally and some asymmetry.  The implants feel very tight and likely she has some thick capsules.  Her past medical history and surgical history as listed below.    Review of Systems  Constitutional: Negative.   HENT: Negative.    Eyes: Negative.   Respiratory: Negative.    Cardiovascular: Negative.   Gastrointestinal: Negative.   Endocrine: Negative.   Genitourinary: Negative.   Musculoskeletal:  Positive for back pain and neck pain.    Past Medical History:  Diagnosis Date   Arthritis    knees   GERD (gastroesophageal reflux disease)    with certain foods   History of shingles    LGSIL (low grade squamous intraepithelial dysplasia) 2009   Osteopenia 12/2013   T score -1.2 FRAX 5.7%/0.3%    Past Surgical History:  Procedure Laterality Date   AUGMENTATION MAMMAPLASTY Bilateral 2003   BLADDER SUSPENSION  2006   COLONOSCOPY  04/29/2009   JP-normal-10 yr recall-mod tics   ROTATOR CUFF REPAIR Right 12/2019   TOTAL KNEE ARTHROPLASTY Left 09/22/2020   Procedure: TOTAL KNEE ARTHROPLASTY;  Surgeon: Melodi Lerner, MD;  Location: [facility];  Service: Orthopedics;  Laterality: Left;   WISDOM TOOTH EXTRACTION        Current Outpatient Medications:    acetaminophen  (TYLENOL )  325 MG tablet, Take 650 mg by mouth every 6 (six) hours as needed for moderate pain., Disp: , Rfl:    Multiple Vitamins-Minerals (MULTIVITAMIN WITH MINERALS) tablet, Take 1 tablet by mouth daily., Disp: , Rfl:    Objective:   Vitals:   04/09/24 1212  BP: 103/65  Pulse: (!) 57  SpO2: 96%    Physical Exam Vitals reviewed.  Constitutional:      Appearance: Normal appearance.  HENT:     Head: Normocephalic and atraumatic.   Cardiovascular:     Rate and Rhythm: Normal rate.     Pulses: Normal pulses.  Pulmonary:     Effort: Pulmonary effort is normal.  Abdominal:     Palpations: Abdomen is soft.   Musculoskeletal:        General: No swelling or deformity.   Skin:    General: Skin is warm.     Capillary Refill: Capillary refill takes less than 2 seconds.   Neurological:     Mental Status: She is alert and oriented to person, place, and time.   Psychiatric:        Mood and Affect: Mood normal.        Behavior: Behavior normal.        Thought Content: Thought content normal.        Judgment: Judgment normal.  Assessment & Plan:  Encounter for counseling  The procedure the patient selected and that was best for the patient was discussed. The risk were discussed and include but not limited to the following:  Breast asymmetry, fluid accumulation, firmness of the breast, inability to breast feed, loss of nipple or areola, skin loss, change in skin and nipple sensation, fat necrosis of the breast tissue, bleeding, infection and healing delay.  There are risks of anesthesia and injury to nerves or blood vessels.  Allergic reaction to tape, suture and skin glue are possible.  There will be swelling.  Any of these can lead to the need for revisional surgery which is not included in this surgery.  A breast reduction has potential to interfere with diagnostic procedures in the future.  This procedure is best done when the breast is fully developed.  Changes in the breast will  continue to occur over time: pregnancy, weight gain or weigh loss. No guarantees are given for a certain bra or breast size.    The patient is interested in removal of the implants and placement of silicone implants.  She would also like a mastopexy for better contour and symmetry.  This seems reasonable.  She would like to be slightly smaller.    Pictures were obtained of the patient and placed in the chart with the patient's or guardian's permission.   Jackie RAMAN Monik Lins, DO

## 2024-04-27 ENCOUNTER — Telehealth: Payer: Self-pay | Admitting: Family Medicine

## 2024-04-27 NOTE — Telephone Encounter (Signed)
 LVM for pt to reschedule appt on 07/02/24--provider out of office//tes

## 2024-05-04 ENCOUNTER — Encounter: Payer: Self-pay | Admitting: Family Medicine

## 2024-05-04 ENCOUNTER — Ambulatory Visit (INDEPENDENT_AMBULATORY_CARE_PROVIDER_SITE_OTHER): Admitting: Family Medicine

## 2024-05-04 VITALS — BP 96/68 | HR 57 | Temp 97.8°F | Ht 63.0 in | Wt 126.5 lb

## 2024-05-04 DIAGNOSIS — M85859 Other specified disorders of bone density and structure, unspecified thigh: Secondary | ICD-10-CM | POA: Diagnosis not present

## 2024-05-04 DIAGNOSIS — Z23 Encounter for immunization: Secondary | ICD-10-CM

## 2024-05-04 DIAGNOSIS — Z0000 Encounter for general adult medical examination without abnormal findings: Secondary | ICD-10-CM

## 2024-05-04 MED ORDER — MUPIROCIN 2 % EX OINT: 1.0000 | TOPICAL_OINTMENT | Freq: Two times a day (BID) | CUTANEOUS | 0 refills | Status: DC

## 2024-05-04 NOTE — Progress Notes (Signed)
 Established Patient Office Visit  Subjective   Patient ID: Jackie Hubbard, female    DOB: [birth year]  Age: 63 y.o. MRN: [id number]  Chief Complaint  Patient presents with   Annual Exam    HPI   Jackie Hubbard is seen today for physical exam.  She still sees gynecologist.  Very health-conscious and fitness minded.  She exercises regularly.  Does some competitive rowing.  Upcoming trip to [place] on September and will be cycling about 240 miles over several days.  She has been cycling quite a bit in preparation for that.  She has some osteoarthritis of joints otherwise generally very healthy.  Does not take any regular medications by prescription.  Health maintenance reviewed:  Health Maintenance  Topic Date Due   COVID-19 Vaccine (5 - 2024-25 season) 05/20/2024 (Originally 06/12/2023)   HIV Screening  05/04/2025 (Originally 01/08/1976)   INFLUENZA VACCINE  05/11/2024   Cervical Cancer Screening (HPV/Pap Cotest)  06/03/2025   MAMMOGRAM  06/27/2025   DTaP/Tdap/Td (3 - Td or Tdap) 08/06/2030   Colonoscopy  05/01/2031   Pneumococcal Vaccine: 50+ Years  Completed   DEXA SCAN  Completed   Hepatitis C Screening  Completed   Zoster Vaccines- Shingrix  Completed   Hepatitis B Vaccines  Aged Out   HPV VACCINES  Aged Out   Meningococcal B Vaccine  Aged Out   - Pap smear and colonoscopy up-to-date - Tetanus due 2031 - Had DEXA scan 2022 with T-score -2.2. - Has completed Shingrix vaccine - No history of pneumonia vaccine  Family history-father died suddenly at age 77 presumably MI.  Mom died of some type of leukemia at age 35.  She has 1 sister alive and well.  No family history of premature CAD.  Social history-she is married.  Non-smoker.  2 grown children.  No regular alcohol.  Past Medical History:  Diagnosis Date   Arthritis    knees   GERD (gastroesophageal reflux disease)    with certain foods   History of shingles    LGSIL (low grade squamous intraepithelial dysplasia) 2009    Osteopenia 12/2013   T score -1.2 FRAX 5.7%/0.3%   Past Surgical History:  Procedure Laterality Date   AUGMENTATION MAMMAPLASTY Bilateral 2003   BLADDER SUSPENSION  2006   COLONOSCOPY  04/29/2009   JP-normal-10 yr recall-mod tics   ROTATOR CUFF REPAIR Right 12/2019   TOTAL KNEE ARTHROPLASTY Left 09/22/2020   Procedure: TOTAL KNEE ARTHROPLASTY;  Surgeon: Melodi Lerner, MD;  Location: [facility];  Service: Orthopedics;  Laterality: Left;   WISDOM TOOTH EXTRACTION      reports that she quit smoking about 30 years ago. Her smoking use included cigarettes. She started smoking about 40 years ago. She has a 10 pack-year smoking history. She has never used smokeless tobacco. She reports that she does not currently use alcohol. She reports that she does not use drugs. family history includes Arthritis in her father; Hypothyroidism in her sister; Leukemia in her mother. Allergies  Allergen Reactions   Penicillins Other (See Comments)    Reaction as a child  Tolerated Cephalosporin Date: 09/23/20.      Review of Systems  Constitutional:  Negative for chills, fever, malaise/fatigue and weight loss.  HENT:  Negative for hearing loss.   Eyes:  Negative for blurred vision and double vision.  Respiratory:  Negative for cough and shortness of breath.   Cardiovascular:  Negative for chest pain, palpitations and leg swelling.  Gastrointestinal:  Negative for abdominal pain, blood  in stool, constipation and diarrhea.  Genitourinary:  Negative for dysuria.  Skin:  Negative for rash.  Neurological:  Negative for dizziness, speech change, seizures, loss of consciousness and headaches.  Psychiatric/Behavioral:  Negative for depression.       Objective:     BP 96/68 (BP Location: Left Arm, Patient Position: Sitting, Cuff Size: Normal)   Pulse (!) 57   Temp 97.8 F (36.6 C) (Oral)   Ht 5' 3 (1.6 m)   Wt 126 lb 8 oz (57.4 kg)   SpO2 96%   BMI 22.41 kg/m  BP Readings from Last 3 Encounters:   05/04/24 96/68  04/09/24 103/65  03/12/24 116/70   Wt Readings from Last 3 Encounters:  05/04/24 126 lb 8 oz (57.4 kg)  04/09/24 128 lb 12.8 oz (58.4 kg)  03/12/24 129 lb 6.4 oz (58.7 kg)      Physical Exam Vitals reviewed.  Constitutional:      General: She is not in acute distress.    Appearance: She is well-developed. She is not ill-appearing.  HENT:     Head: Normocephalic and atraumatic.  Eyes:     Pupils: Pupils are equal, round, and reactive to light.  Neck:     Thyroid : No thyromegaly.  Cardiovascular:     Rate and Rhythm: Normal rate and regular rhythm.     Heart sounds: Normal heart sounds. No murmur heard. Pulmonary:     Effort: No respiratory distress.     Breath sounds: Normal breath sounds. No wheezing or rales.  Abdominal:     General: Bowel sounds are normal. There is no distension.     Palpations: Abdomen is soft. There is no mass.     Tenderness: There is no abdominal tenderness. There is no guarding or rebound.  Musculoskeletal:        General: Normal range of motion.     Cervical back: Normal range of motion and neck supple.  Lymphadenopathy:     Cervical: No cervical adenopathy.  Skin:    Findings: No rash.  Neurological:     Mental Status: She is alert and oriented to person, place, and time.     Cranial Nerves: No cranial nerve deficit.     Deep Tendon Reflexes: Reflexes normal.  Psychiatric:        Behavior: Behavior normal.        Thought Content: Thought content normal.        Judgment: Judgment normal.      No results found for any visits on 05/04/24.    The ASCVD Risk score (Arnett DK, et al., 2019) failed to calculate for the following reasons:   Cannot find a previous HDL lab   Cannot find a previous total cholesterol lab    Assessment & Plan:   Problem List Items Addressed This Visit   None Visit Diagnoses       Osteopenia of hip, unspecified laterality    -  Primary   Relevant Orders   DG Bone Density     Physical  exam       Relevant Orders   Basic metabolic panel with GFR   CBC with Differential/Platelet   Hepatic function panel   Lipid panel     Need for pneumococcal vaccine       Relevant Orders   Pneumococcal conjugate vaccine 20-valent (Prevnar 20) (Completed)     Here for physical exam.  Generally very healthy.  Very health-conscious.  Exercises regularly.  Past history of osteopenia  by DEXA scan 2022 with T-score -2.2.  We discussed the following health maintenance items:  -Obtain labs as above - Consider annual flu vaccine - Prevnar 20 given - Ordered repeat DEXA scan - Discussed recommendations for daily calcium 1200 mg and vitamin D  at 1000 international units. - She will continue GYN follow-up for mammograms and Pap smears. - Colonoscopy up-to-date  No follow-ups on file.    Wolm Scarlet, MD

## 2024-05-05 LAB — LIPID PANEL
Cholesterol: 206 mg/dL — ABNORMAL HIGH (ref <200)
HDL: 87 mg/dL (ref >=50)
LDL Cholesterol (Calc): 106 mg/dL — ABNORMAL HIGH
Non-HDL Cholesterol (Calc): 119 mg/dL (ref <130)
Total CHOL/HDL Ratio: 2.4 (calc) (ref <5.0)
Triglycerides: 51 mg/dL (ref <150)

## 2024-05-05 LAB — HEPATIC FUNCTION PANEL
AG Ratio: 2.1 (calc) (ref 1.0–2.5)
ALT: 14 U/L (ref 6–29)
AST: 15 U/L (ref 10–35)
Albumin: 4.4 g/dL (ref 3.6–5.1)
Alkaline phosphatase (APISO): 73 U/L (ref 37–153)
Bilirubin, Direct: 0.1 mg/dL (ref 0.0–0.2)
Globulin: 2.1 g/dL (ref 1.9–3.7)
Indirect Bilirubin: 0.4 mg/dL (ref 0.2–1.2)
Total Bilirubin: 0.5 mg/dL (ref 0.2–1.2)
Total Protein: 6.5 g/dL (ref 6.1–8.1)

## 2024-05-05 LAB — CBC WITH DIFFERENTIAL/PLATELET
Absolute Lymphocytes: 1903 {cells}/uL (ref 850–3900)
Absolute Monocytes: 603 {cells}/uL (ref 200–950)
Basophils Absolute: 80 {cells}/uL (ref 0–200)
Basophils Relative: 1.2 %
Eosinophils Absolute: 47 {cells}/uL (ref 15–500)
Eosinophils Relative: 0.7 %
HCT: 41 % (ref 35.0–45.0)
Hemoglobin: 13.2 g/dL (ref 11.7–15.5)
MCH: 29.9 pg (ref 27.0–33.0)
MCHC: 32.2 g/dL (ref 32.0–36.0)
MCV: 93 fL (ref 80.0–100.0)
MPV: 11.2 fL (ref 7.5–12.5)
Monocytes Relative: 9 %
Neutro Abs: 4067 {cells}/uL (ref 1500–7800)
Neutrophils Relative %: 60.7 %
Platelets: 228 Thousand/uL (ref 140–400)
RBC: 4.41 Million/uL (ref 3.80–5.10)
RDW: 13.1 % (ref 11.0–15.0)
Total Lymphocyte: 28.4 %
WBC: 6.7 Thousand/uL (ref 3.8–10.8)

## 2024-05-05 LAB — BASIC METABOLIC PANEL WITH GFR
BUN: 19 mg/dL (ref 7–25)
CO2: 27 mmol/L (ref 20–32)
Calcium: 9.1 mg/dL (ref 8.6–10.4)
Chloride: 103 mmol/L (ref 98–110)
Creat: 0.76 mg/dL (ref 0.50–1.05)
Glucose, Bld: 79 mg/dL (ref 65–99)
Potassium: 4.4 mmol/L (ref 3.5–5.3)
Sodium: 136 mmol/L (ref 135–146)
eGFR: 87 mL/min/1.73m2 (ref >=60)

## 2024-05-06 ENCOUNTER — Ambulatory Visit: Payer: Self-pay | Admitting: Family Medicine

## 2024-05-29 ENCOUNTER — Inpatient Hospital Stay: Admission: RE | Admit: 2024-05-29 | Source: Ambulatory Visit

## 2024-05-29 ENCOUNTER — Ambulatory Visit (INDEPENDENT_AMBULATORY_CARE_PROVIDER_SITE_OTHER)
Admission: RE | Admit: 2024-05-29 | Discharge: 2024-05-29 | Disposition: A | Discharge status: Home / Self Care | Source: Ambulatory Visit | Attending: Family Medicine | Admitting: Family Medicine

## 2024-05-29 DIAGNOSIS — M85859 Other specified disorders of bone density and structure, unspecified thigh: Secondary | ICD-10-CM | POA: Diagnosis not present

## 2024-06-01 ENCOUNTER — Ambulatory Visit (INDEPENDENT_AMBULATORY_CARE_PROVIDER_SITE_OTHER): Admitting: Family Medicine

## 2024-06-01 ENCOUNTER — Encounter: Payer: Self-pay | Admitting: Family Medicine

## 2024-06-01 VITALS — BP 110/70 | HR 56 | Temp 97.5°F | Wt 125.4 lb

## 2024-06-01 DIAGNOSIS — M810 Age-related osteoporosis without current pathological fracture: Secondary | ICD-10-CM

## 2024-06-01 LAB — VITAMIN D 25 HYDROXY (VIT D DEFICIENCY, FRACTURES): VITD: 39.15 ng/mL (ref 30.00–100.00)

## 2024-06-01 LAB — PHOSPHORUS: Phosphorus: 4.6 mg/dL (ref 2.3–4.6)

## 2024-06-01 LAB — TSH: TSH: 1.01 u[IU]/mL (ref 0.35–5.50)

## 2024-06-01 NOTE — Patient Instructions (Signed)
 Make sure getting daily Calcium 1,200 mg and Vit D 800 international units .  Consider bisphosponate such as Fosamax.

## 2024-06-01 NOTE — Progress Notes (Signed)
 Established Patient Office Visit  Subjective   Patient ID: Jackie Hubbard, female    DOB: 11-19-1960  Age: 63 y.o. MRN: [id number]  Chief Complaint  Patient presents with   Results    HPI   Jackie Hubbard is here to discuss recent DEXA scan.  She had osteoporosis left hip.  T-score -2.7 left femur and -1.9 right femur.  0.0 lumbar spine but it was noted that she had some scoliosis and arthritis changes that likely led to false normal T-score for lumbar spine.  Patient is very health-conscious and exercises regularly.  She does a lot of rowing but also a fair amount of walking and hiking.  She does strength training fairly regularly.  No recent fracture.  Inconsistent use of calcium and vitamin D .  No regular alcohol use.  Non-smoker.  She did have prior DEXA scan 2022 which showed osteopenia and has shown some progression since then.  Has never been on any specific therapies for osteoporosis.  Past Medical History:  Diagnosis Date   Arthritis    knees   GERD (gastroesophageal reflux disease)    with certain foods   History of shingles    LGSIL (low grade squamous intraepithelial dysplasia) 2009   Osteopenia 12/2013   T score -1.2 FRAX 5.7%/0.3%   Past Surgical History:  Procedure Laterality Date   AUGMENTATION MAMMAPLASTY Bilateral 2003   BLADDER SUSPENSION  2006   COLONOSCOPY  04/29/2009   JP-normal-10 yr recall-mod tics   ROTATOR CUFF REPAIR Right 12/2019   TOTAL KNEE ARTHROPLASTY Left 09/22/2020   Procedure: TOTAL KNEE ARTHROPLASTY;  Surgeon: Melodi Lerner, MD;  Location: [facility];  Service: Orthopedics;  Laterality: Left;   WISDOM TOOTH EXTRACTION      reports that she quit smoking about 30 years ago. Her smoking use included cigarettes. She started smoking about 40 years ago. She has a 10 pack-year smoking history. She has never used smokeless tobacco. She reports that she does not currently use alcohol. She reports that she does not use drugs. family history includes Arthritis  in her father; Hypothyroidism in her sister; Leukemia in her mother. Allergies  Allergen Reactions   Penicillins Other (See Comments)    Reaction as a child  Tolerated Cephalosporin Date: 09/23/20.      Review of Systems  Constitutional:  Negative for malaise/fatigue.  Eyes:  Negative for blurred vision.  Respiratory:  Negative for shortness of breath.   Cardiovascular:  Negative for chest pain.  Neurological:  Negative for dizziness, weakness and headaches.      Objective:     BP 110/70   Pulse (!) 56   Temp (!) 97.5 F (36.4 C) (Oral)   Wt 125 lb 6.4 oz (56.9 kg)   SpO2 98%   BMI 22.21 kg/m  BP Readings from Last 3 Encounters:  06/01/24 110/70  05/04/24 96/68  04/09/24 103/65   Wt Readings from Last 3 Encounters:  06/01/24 125 lb 6.4 oz (56.9 kg)  05/04/24 126 lb 8 oz (57.4 kg)  04/09/24 128 lb 12.8 oz (58.4 kg)      Physical Exam Vitals reviewed.  Cardiovascular:     Rate and Rhythm: Normal rate and regular rhythm.      No results found for any visits on 06/01/24.    The 10-year ASCVD risk score (Arnett DK, et al., 2019) is: 3.5%    Assessment & Plan:   Problem List Items Addressed This Visit   None Visit Diagnoses  Osteoporosis, unspecified osteoporosis type, unspecified pathological fracture presence    -  Primary   Relevant Orders   VITAMIN D  25 Hydroxy (Vit-D Deficiency, Fractures)   TSH   Phosphorus      Patient has osteoporosis by recent DEXA scan with T-score -2.7 left femur.  We suggested several things as follows  - Check further labs as above with 25-hydroxy vitamin D , TSH, phosphorus -Recommend daily calcium 1200 mg and vitamin D  800 to 1000 international units -Continue regular weightbearing exercise -We did recommend she consider possible medication options with Fosamax.  She has no known contraindications.  She declines at this time but will consider. - Repeat DEXA scan within 2 years  No follow-ups on file.     Wolm Scarlet, MD

## 2024-06-03 ENCOUNTER — Ambulatory Visit: Payer: Self-pay | Admitting: Family Medicine

## 2024-06-29 ENCOUNTER — Institutional Professional Consult (permissible substitution): Admitting: Plastic Surgery

## 2024-07-02 ENCOUNTER — Encounter: Payer: Self-pay | Admitting: Family Medicine

## 2024-07-10 ENCOUNTER — Encounter: Payer: Self-pay | Admitting: Nurse Practitioner

## 2024-07-10 ENCOUNTER — Ambulatory Visit: Admitting: Nurse Practitioner

## 2024-07-10 VITALS — BP 100/70 | HR 61 | Ht 61.25 in | Wt 129.0 lb

## 2024-07-10 DIAGNOSIS — Z1331 Encounter for screening for depression: Secondary | ICD-10-CM | POA: Diagnosis not present

## 2024-07-10 DIAGNOSIS — M810 Age-related osteoporosis without current pathological fracture: Secondary | ICD-10-CM | POA: Diagnosis not present

## 2024-07-10 DIAGNOSIS — Z01419 Encounter for gynecological examination (general) (routine) without abnormal findings: Secondary | ICD-10-CM

## 2024-07-10 DIAGNOSIS — N8110 Cystocele, unspecified: Secondary | ICD-10-CM

## 2024-07-10 DIAGNOSIS — Z780 Asymptomatic menopausal state: Secondary | ICD-10-CM

## 2024-07-10 NOTE — Progress Notes (Signed)
 Jackie Hubbard Jan 06, 1961 [id number]   History:  63 y.o. G3P3 presents for annual exam without GYN complaints. Postmenopausal - no HRT, no bleeding. 2009 LGSIL, subsequent paps normal. 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 Declines medication management and wants to optimize Calcium and Vit D. Very active in exercise. 2006 bladder suspension. Has occasional urgency.   Gynecologic History No LMP recorded. Patient is postmenopausal.   Contraception/Family planning: post menopausal status Sexually active: Yes  Health Maintenance Last Pap: 06/03/2022. Results were: Normal, 3-year repeat Last mammogram: 06/28/2023. Results were: Normal Last colonoscopy: 04/30/2021. Results were: Normal, 10-year recall Last Dexa: 05/29/2024. Results were: T-score -2.7 (left femur), -1.9 (right femur)     07/10/2024    4:00 PM  Depression screen PHQ 2/9  Decreased Interest 0  Down, Depressed, Hopeless 0  PHQ - 2 Score 0     Past medical history, past surgical history, family history and social history were all reviewed and documented in the EPIC chart. Married. Works remote in [employer]. Two daughters.   ROS:  A ROS was performed and pertinent positives and negatives are included.  Exam:  Vitals:   07/10/24 1556  BP: 100/70  Pulse: 61  SpO2: 98%  Weight: 129 lb (58.5 kg)  Height: 5' 1.25 (1.556 m)     Body mass index is 24.18 kg/m.  General appearance:  Normal Thyroid :  Symmetrical, normal in size, without palpable masses or nodularity. Respiratory  Auscultation:  Clear without wheezing or rhonchi Cardiovascular  Auscultation:  Regular rate, without rubs, murmurs or gallops  Edema/varicosities:  Not grossly evident Abdominal  Soft,nontender, without masses, guarding or rebound.  Liver/spleen:  No organomegaly noted  Hernia:  None appreciated  Skin  Inspection:  Grossly normal Breasts: Examined lying and sitting. Bilateral breast implants noted.   Right: Without masses,  retractions, nipple discharge or axillary adenopathy.   Left: Without masses, retractions, nipple discharge or axillary adenopathy. Pelvic: External genitalia:  no lesions              Urethra:  normal appearing urethra with no masses, tenderness or lesions              Bartholins and Skenes: normal                 Vagina: normal appearing vagina with normal color and discharge, no lesions. Atrophic changes, 1+ cystocele              Cervix: no lesions Bimanual Exam:  Uterus:  no masses or tenderness              Adnexa: no mass, fullness, tenderness              Rectovaginal: Deferred              Anus:  normal, no lesions  Jackie Hubbard, CMA present as chaperone.   Assessment/Plan:  63 y.o. G3P3 for annual exam.    Well female exam with routine gynecological exam - Education provided on SBEs, importance of preventative screenings, current guidelines, high calcium diet, regular exercise, and multivitamin daily. Labs with PCP.   Postmenopausal - no HRT, no bleeding  Age-related osteoporosis without current pathological fracture - T-score -2.7 of left femur. Managed by PCP. Declines medication management and wants to optimize Calcium and Vit D. Very active in exercise.  Baden-Walker grade 1 cystocele - occasional urgency. 2006 bladder suspension. Recommend pelvic floor strengthening exercises or PFPT.  Screening for cervical cancer - Normal Pap history. Will repeat at  3-year interval per guidelines.   Screening for breast cancer - Normal mammogram history.  Continue annual screenings.  Normal breast exam today.  Screening for colon cancer - 2022 colonoscopy. Will repeat at GI's recommended interval.   Return in about 1 year (around 07/10/2025) for Annual.      Jackie DELENA Shutter DNP, 4:25 PM 07/10/2024

## 2024-07-30 ENCOUNTER — Other Ambulatory Visit: Payer: Self-pay | Admitting: Family Medicine

## 2024-07-30 DIAGNOSIS — Z1231 Encounter for screening mammogram for malignant neoplasm of breast: Secondary | ICD-10-CM

## 2024-08-13 ENCOUNTER — Encounter: Payer: Self-pay | Admitting: Podiatry

## 2024-08-13 ENCOUNTER — Ambulatory Visit

## 2024-08-13 ENCOUNTER — Ambulatory Visit: Admitting: Podiatry

## 2024-08-13 VITALS — Ht 61.25 in | Wt 129.0 lb

## 2024-08-13 DIAGNOSIS — M2042 Other hammer toe(s) (acquired), left foot: Secondary | ICD-10-CM

## 2024-08-13 NOTE — Progress Notes (Signed)
   Chief Complaint  Patient presents with   Foot Pain    Pt is here due to left foot pain, states the pain is at the top of her foot and has been bothering her for about a month no injury to the foot.    HPI: 63 y.o. female presenting today for new complaint of pain and tenderness associated to the second digit left foot.  History of hammertoe to this area.  Patient states that she went bicycling 240 miles in [place] and developed left second toe pain  Past Medical History:  Diagnosis Date   Arthritis    knees   GERD (gastroesophageal reflux disease)    with certain foods   History of shingles    LGSIL (low grade squamous intraepithelial dysplasia) 2009   Osteopenia 12/2013   T score -1.2 FRAX 5.7%/0.3%   Osteoporosis     Past Surgical History:  Procedure Laterality Date   AUGMENTATION MAMMAPLASTY Bilateral 2003   BLADDER SUSPENSION  2006   COLONOSCOPY  04/29/2009   JP-normal-10 yr recall-mod tics   ROTATOR CUFF REPAIR Right 12/2019   TOTAL KNEE ARTHROPLASTY Left 09/22/2020   Procedure: TOTAL KNEE ARTHROPLASTY;  Surgeon: Melodi Lerner, MD;  Location: [facility];  Service: Orthopedics;  Laterality: Left;   WISDOM TOOTH EXTRACTION      Allergies  Allergen Reactions   Penicillins Other (See Comments)    Reaction as a child  Tolerated Cephalosporin Date: 09/23/20.       Physical Exam: General: The patient is alert and oriented x3 in no acute distress.  Dermatology: Skin is warm, dry and supple bilateral lower extremities.   Vascular: Palpable pedal pulses bilaterally. Capillary refill within normal limits.  No appreciable edema.  No erythema.  Neurological: Grossly intact via light touch  Musculoskeletal Exam: Hammertoe noted second digit left foot.  Tenderness with palpation range of motion second TP of the left foot  Radiographic Exam LT foot 08/13/2024:  Hammertoe contracture deformity noted to the second digit left foot.  No acute fracture  identified.  Assessment/Plan of Care: 1.  Hammertoe second digit left foot 2.  Second MTP capsulitis left  -Patient evaluated.  X-rays reviewed -Declined NSAIDs.  She takes OTC ibuprofen as needed -Today we discussed different treatment methods including conservative care which would include anti-inflammatory and good supportive tennis shoes.  Also discussed surgical repair of the hammertoe but for now she wants to continue conservative management -Recommend good supportive tennis shoes -Recommend activities that do not irritate the forefoot or hammertoe -Recommend padding to alleviate pressure from the dorsal aspect of the hammertoe -Return to clinic PRN       Thresa EMERSON Sar, DPM [facility]  Dr. Thresa EMERSON Sar, DPM    [address] [place], [place] [address]                Office [phone]  Fax [phone]

## 2024-08-16 ENCOUNTER — Ambulatory Visit
Admission: RE | Admit: 2024-08-16 | Discharge: 2024-08-16 | Disposition: A | Source: Ambulatory Visit | Attending: Family Medicine | Admitting: Family Medicine

## 2024-08-16 DIAGNOSIS — Z1231 Encounter for screening mammogram for malignant neoplasm of breast: Secondary | ICD-10-CM

## 2024-10-05 ENCOUNTER — Ambulatory Visit: Payer: Self-pay

## 2024-10-05 NOTE — Telephone Encounter (Signed)
 FYI Only or Action Required?: Action required by provider: Refusing ED, needs call back asap, requesting meds, alerted [facility] to ED refusal.  Patient was last seen in primary care on 06/01/2024 by Micheal Wolm ORN, MD.  Called Nurse Triage reporting Shortness of Breath.  Symptoms began several days ago.  Interventions attempted: Rest, hydration, or home remedies.  Symptoms are: rapidly worsening.  Triage Disposition: Go to ED Now (Notify PCP)  Patient/caregiver understands and will follow disposition?: No, refuses disposition     Copied from CRM [id number]. Topic: Clinical - Red Word Triage >> Oct 05, 2024  8:43 AM Jeoffrey H wrote: [facility] that prompted transfer to Nurse Triage: Patient complaining of shortness of breath, congestion in chest, cough, really exhausted. Denied any other symps. Low grade fever. Reason for Disposition  Extra heartbeats, irregular heart beating, or heart is beating very fast  (i.e., palpitations)  Answer Assessment - Initial Assessment Questions This RN recommended pt be examined in ED today, pt refusing. Advised pt drink lots of water  rather than coffee or tea, try tylenol /motrin, confirmed could try mucinex. Advised ED if any worsening. Pt verbalized understanding. Pt requesting prescription, sending message to PCP office.    Heart just started racing and scared me a little bit Cough is terrible Low grade 99 F SOB not when sitting or resting Denies chest pain No wheezing or stridor Coughing up mucus clear to faint yellow but was drinking tea Sore throat minor Extremely thirsty Hoping for a prescription, prefer [facility] on [address] Don't need go to hospital  Protocols used: Breathing Difficulty-A-AH

## 2024-10-05 NOTE — Telephone Encounter (Signed)
 Noted

## 2024-10-05 NOTE — Telephone Encounter (Signed)
 Spoke with the patient and advised she seek treatment immediately at the ER due to the symptoms below. Offered assistance in locations close to her home, to call family member and patient declined.

## 2024-10-05 NOTE — Telephone Encounter (Signed)
 Agree with immediate need for evaluation in the ED or urgent care.
